# Patient Record
Sex: Female | Born: 1985 | Race: White | Hispanic: No | Marital: Married | Smoking: Never smoker
Health system: Southern US, Community
[De-identification: ages and names within clinical notes are randomized; demographics above are authoritative.]

## PROBLEM LIST (undated history)

## (undated) DIAGNOSIS — N390 Urinary tract infection, site not specified: Secondary | ICD-10-CM

## (undated) DIAGNOSIS — F419 Anxiety disorder, unspecified: Secondary | ICD-10-CM

## (undated) HISTORY — DX: Urinary tract infection, site not specified: N39.0

---

## 2014-03-10 DIAGNOSIS — F32A Depression, unspecified: Secondary | ICD-10-CM | POA: Insufficient documentation

## 2017-08-24 DIAGNOSIS — L720 Epidermal cyst: Secondary | ICD-10-CM | POA: Insufficient documentation

## 2017-08-24 DIAGNOSIS — N390 Urinary tract infection, site not specified: Secondary | ICD-10-CM | POA: Insufficient documentation

## 2017-08-24 DIAGNOSIS — D239 Other benign neoplasm of skin, unspecified: Secondary | ICD-10-CM | POA: Insufficient documentation

## 2020-01-22 ENCOUNTER — Encounter: Payer: Self-pay | Admitting: Certified Nurse Midwife

## 2020-01-22 ENCOUNTER — Ambulatory Visit (INDEPENDENT_AMBULATORY_CARE_PROVIDER_SITE_OTHER): Payer: Self-pay | Admitting: Certified Nurse Midwife

## 2020-01-22 VITALS — BP 107/73 | HR 75 | Ht 63.0 in | Wt 143.0 lb

## 2020-01-22 DIAGNOSIS — N926 Irregular menstruation, unspecified: Secondary | ICD-10-CM

## 2020-01-22 LAB — POCT URINE PREGNANCY: Preg Test, Ur: POSITIVE — AB

## 2020-01-22 NOTE — Patient Instructions (Signed)

## 2020-01-22 NOTE — Progress Notes (Signed)
Subjective:    Tiffany Franco is a 34 y.o. female who presents for evaluation of amenorrhea. She believes she could be pregnant. Pregnancy is desired. Sexual Activity: single partner, contraception: none. Current symptoms also include: nausea. Last period was normal.   Patient's last menstrual period was 11/23/2019 (lmp unknown). The following portions of the patient's history were reviewed and updated as appropriate: allergies, current medications, past family history, past medical history, past social history, past surgical history and problem list.  Review of Systems Pertinent items are noted in HPI.     Objective:    BP 107/73   Pulse 75   Ht 5\' 3"  (1.6 m)   Wt 143 lb (64.9 kg)   LMP 11/23/2019 (LMP Unknown)   BMI 25.33 kg/m  General: alert, cooperative, appears stated age and no acute distress    Lab Review Urine HCG: positive    Assessment:    Absence of menstruation.     Plan:   Positive: EDC: 08/29/20. Briefly discussed pre-natal care options. Midwifery or MD care discussed.  Encouraged well-balanced diet, plenty of rest when needed, pre-natal vitamins daily and walking for exercise. Discussed self-help for nausea, avoiding OTC medications until consulting provider or pharmacist, other than Tylenol as needed, minimal caffeine (1-2 cups daily) and avoiding alcohol. She will schedule dating u/s next week, the nurse visit @ 10 wks and  her initial OB visit @ 12 wks Feel free to call with any questions.   Philip Aspen, CNM

## 2020-01-22 NOTE — Progress Notes (Signed)
PT is present today for confirmation of pregnancy. Pt LMP 11/23/2019. UPT done today results were positive. Pt stated having nausea at night.

## 2020-01-24 ENCOUNTER — Ambulatory Visit (INDEPENDENT_AMBULATORY_CARE_PROVIDER_SITE_OTHER): Payer: Self-pay

## 2020-01-24 DIAGNOSIS — N926 Irregular menstruation, unspecified: Secondary | ICD-10-CM

## 2020-02-13 ENCOUNTER — Ambulatory Visit (INDEPENDENT_AMBULATORY_CARE_PROVIDER_SITE_OTHER): Payer: Self-pay

## 2020-02-13 ENCOUNTER — Ambulatory Visit: Payer: Self-pay | Admitting: Certified Nurse Midwife

## 2020-02-13 ENCOUNTER — Telehealth: Payer: Self-pay | Admitting: Certified Nurse Midwife

## 2020-02-13 ENCOUNTER — Other Ambulatory Visit: Payer: Self-pay

## 2020-02-13 ENCOUNTER — Telehealth: Payer: Self-pay

## 2020-02-13 ENCOUNTER — Other Ambulatory Visit: Payer: Self-pay | Admitting: Certified Nurse Midwife

## 2020-02-13 VITALS — BP 106/70 | HR 71 | Wt 144.7 lb

## 2020-02-13 DIAGNOSIS — Z789 Other specified health status: Secondary | ICD-10-CM

## 2020-02-13 DIAGNOSIS — Z3482 Encounter for supervision of other normal pregnancy, second trimester: Secondary | ICD-10-CM

## 2020-02-13 DIAGNOSIS — Z3A12 12 weeks gestation of pregnancy: Secondary | ICD-10-CM

## 2020-02-13 LAB — OB RESULTS CONSOLE VARICELLA ZOSTER ANTIBODY, IGG: Varicella: IMMUNE

## 2020-02-13 NOTE — Progress Notes (Signed)
Herminia Schillo presents for [place] interview visit. Pregnancy confirmation done 01/22/20.  G3. P2003. Pregnancy education material explained and given. No cats in the home. NOB labs ordered. HIV labs and Drug screen were explained optional and she declined. Drug screen declined. PNV encouraged. Genetic screening options discussed. Genetic testing: Declined.  Pt may discuss with provider. Pt. To follow up with provider in 1 week for NOB physical.  All questions answered.

## 2020-02-13 NOTE — Telephone Encounter (Signed)
Returned patients call- she reports she started bleeding - light pink in color- last PM. Denies any cramping or recent intercourse. Advised to put a pad on and monitor. After 1 hour reach back out to [place] via mychart to report on amount of bleeding. Encouraged to increase fluids also. Patient is in agreement.

## 2020-02-13 NOTE — Telephone Encounter (Signed)
Pt called in and stated that she has been bleeding all night. The pt states she thinks she is having a miscarriage. The pt is requesting a call back from the nurse. I told her I would send a message and someone will call her. Please advise

## 2020-02-14 LAB — CBC WITH DIFFERENTIAL/PLATELET
Basophils Absolute: 0 10*3/uL (ref 0.0–0.2)
Basos: 0 %
EOS (ABSOLUTE): 0.4 10*3/uL (ref 0.0–0.4)
Eos: 4 %
Hematocrit: 39.6 % (ref 34.0–46.6)
Hemoglobin: 13.1 g/dL (ref 11.1–15.9)
Immature Grans (Abs): 0 10*3/uL (ref 0.0–0.1)
Immature Granulocytes: 0 %
Lymphocytes Absolute: 2.1 10*3/uL (ref 0.7–3.1)
Lymphs: 19 %
MCH: 30.3 pg (ref 26.6–33.0)
MCHC: 33.1 g/dL (ref 31.5–35.7)
MCV: 92 fL (ref 79–97)
Monocytes Absolute: 0.8 10*3/uL (ref 0.1–0.9)
Monocytes: 7 %
Neutrophils Absolute: 7.8 10*3/uL — ABNORMAL HIGH (ref 1.4–7.0)
Neutrophils: 70 %
Platelets: 286 10*3/uL (ref 150–450)
RBC: 4.32 x10E6/uL (ref 3.77–5.28)
RDW: 12.3 % (ref 11.7–15.4)
WBC: 11.1 10*3/uL — ABNORMAL HIGH (ref 3.4–10.8)

## 2020-02-14 LAB — MICROSCOPIC EXAMINATION
Casts: NONE SEEN /lpf
RBC, Urine: NONE SEEN /hpf (ref 0–2)

## 2020-02-14 LAB — URINALYSIS, ROUTINE W REFLEX MICROSCOPIC
Bilirubin, UA: NEGATIVE
Glucose, UA: NEGATIVE
Ketones, UA: NEGATIVE
Leukocytes,UA: NEGATIVE
Nitrite, UA: NEGATIVE
Protein,UA: NEGATIVE
Specific Gravity, UA: 1.015 (ref 1.005–1.030)
Urobilinogen, Ur: 0.2 mg/dL (ref 0.2–1.0)
pH, UA: 6 (ref 5.0–7.5)

## 2020-02-14 LAB — ANTIBODY SCREEN: Antibody Screen: NEGATIVE

## 2020-02-14 LAB — RPR: RPR Ser Ql: NONREACTIVE

## 2020-02-14 LAB — "ABO AND RH ": Rh Factor: POSITIVE

## 2020-02-14 LAB — VARICELLA ZOSTER ANTIBODY, IGG: Varicella zoster IgG: 643 index (ref >165)

## 2020-02-14 LAB — HEPATITIS B SURFACE ANTIGEN: Hepatitis B Surface Ag: NEGATIVE

## 2020-02-14 LAB — RUBELLA SCREEN: Rubella Antibodies, IGG: 2.78 index (ref >0.99)

## 2020-02-15 LAB — GC/CHLAMYDIA PROBE AMP
Chlamydia trachomatis, NAA: NEGATIVE
Neisseria Gonorrhoeae by PCR: NEGATIVE

## 2020-02-15 LAB — URINE CULTURE, OB REFLEX: Organism ID, Bacteria: NO GROWTH

## 2020-02-15 LAB — CULTURE, OB URINE

## 2020-02-20 ENCOUNTER — Encounter: Payer: Self-pay | Admitting: Certified Nurse Midwife

## 2020-02-27 ENCOUNTER — Encounter: Payer: Self-pay | Admitting: Certified Nurse Midwife

## 2020-02-27 ENCOUNTER — Ambulatory Visit (INDEPENDENT_AMBULATORY_CARE_PROVIDER_SITE_OTHER): Payer: Self-pay | Admitting: Certified Nurse Midwife

## 2020-02-27 ENCOUNTER — Other Ambulatory Visit: Payer: Self-pay

## 2020-02-27 VITALS — BP 94/61 | HR 71 | Wt 147.3 lb

## 2020-02-27 DIAGNOSIS — Z3A14 14 weeks gestation of pregnancy: Secondary | ICD-10-CM

## 2020-02-27 DIAGNOSIS — Z3482 Encounter for supervision of other normal pregnancy, second trimester: Secondary | ICD-10-CM

## 2020-02-27 DIAGNOSIS — Z8489 Family history of other specified conditions: Secondary | ICD-10-CM | POA: Insufficient documentation

## 2020-02-27 DIAGNOSIS — O09529 Supervision of elderly multigravida, unspecified trimester: Secondary | ICD-10-CM | POA: Insufficient documentation

## 2020-02-27 DIAGNOSIS — Z98891 History of uterine scar from previous surgery: Secondary | ICD-10-CM | POA: Insufficient documentation

## 2020-02-27 DIAGNOSIS — Z8759 Personal history of other complications of pregnancy, childbirth and the puerperium: Secondary | ICD-10-CM | POA: Insufficient documentation

## 2020-02-27 HISTORY — DX: Supervision of elderly multigravida, unspecified trimester: O09.529

## 2020-02-27 LAB — POCT URINALYSIS DIPSTICK OB
Bilirubin, UA: NEGATIVE
Glucose, UA: NEGATIVE
Ketones, UA: NEGATIVE
Leukocytes, UA: NEGATIVE
Nitrite, UA: NEGATIVE
POC,PROTEIN,UA: NEGATIVE
Spec Grav, UA: 1.015 (ref 1.010–1.025)
Urobilinogen, UA: 0.2 E.U./dL
pH, UA: 7.5 (ref 5.0–8.0)

## 2020-02-27 MED ORDER — ASPIRIN EC 81 MG PO TBEC: 81.0000 mg | DELAYED_RELEASE_TABLET | Freq: Every day | ORAL | 11 refills | Status: DC

## 2020-02-27 NOTE — Progress Notes (Signed)
encou

## 2020-02-27 NOTE — Progress Notes (Signed)
NEW OB HISTORY AND PHYSICAL  SUBJECTIVE:       Tiffany Franco is a 34 y.o. G90P2003 female, Patient's last menstrual period was 11/23/2019 (lmp unknown)., Estimated Date of Delivery: 08/26/20, [redacted]w[redacted]d, presents today for establishment of Prenatal Care. She and complains of brown discharge over the past few days.       Gynecologic History Patient's last menstrual period was 11/23/2019 (lmp unknown). Normal Contraception: none Last Pap: 12/11/2020. Results were: normal  Obstetric History OB History  Gravida Para Term Preterm AB Living  3 2 2     3   SAB TAB Ectopic Multiple Live Births        1 3    # Outcome Date GA Lbr Len/2nd Weight Sex Delivery Anes PTL Lv  3 Current           2A Term 05/11/16   7 lb 2 oz (3.232 kg) F CS-LTranv   LIV  2B Term 05/11/16   7 lb 8 oz (3.402 kg) M Vag-Spont   LIV  1 Term 03/03/10   7 lb 8 oz (3.402 kg) F Vag-Spont   LIV    Past Medical History:  Diagnosis Date  . Recurrent UTI     Past Surgical History:  Procedure Laterality Date  . CESAREAN SECTION      Current Outpatient Medications on File Prior to Visit  Medication Sig Dispense Refill  . Prenatal Vit-Fe Fumarate-FA (PRENATAL PO) Take by mouth.     No current facility-administered medications on file prior to visit.    No Known Allergies  Social History   Socioeconomic History  . Marital status: Married    Spouse name: Not on file  . Number of children: Not on file  . Years of education: Not on file  . Highest education level: Not on file  Occupational History  . Not on file  Tobacco Use  . Smoking status: Never Smoker  . Smokeless tobacco: Never Used  Vaping Use  . Vaping Use: Never used  Substance and Sexual Activity  . Alcohol use: Never  . Drug use: Never  . Sexual activity: Yes    Birth control/protection: None  Other Topics Concern  . Not on file  Social History Narrative  . Not on file   Social Determinants of Health   Financial Resource Strain:   .  Difficulty of Paying Living Expenses: Not on file  Food Insecurity:   . Worried About Charity fundraiser in the Last Year: Not on file  . Ran Out of Food in the Last Year: Not on file  Transportation Needs:   . Lack of Transportation (Medical): Not on file  . Lack of Transportation (Non-Medical): Not on file  Physical Activity:   . Days of Exercise per Week: Not on file  . Minutes of Exercise per Session: Not on file  Stress:   . Feeling of Stress : Not on file  Social Connections:   . Frequency of Communication with Friends and Family: Not on file  . Frequency of Social Gatherings with Friends and Family: Not on file  . Attends Religious Services: Not on file  . Active Member of Clubs or Organizations: Not on file  . Attends Archivist Meetings: Not on file  . Marital Status: Not on file  Intimate Partner Violence:   . Fear of Current or Ex-Partner: Not on file  . Emotionally Abused: Not on file  . Physically Abused: Not on file  . Sexually Abused: Not  on file    Family History  Problem Relation Age of Onset  . Healthy Mother   . Healthy Father   . Healthy Sister   . Healthy Brother   . Breast cancer Maternal Grandmother   . Diabetes Maternal Grandmother   . Healthy Maternal Grandfather   . Breast cancer Paternal Grandmother   . Healthy Paternal Grandfather   . Healthy Brother   . Healthy Brother   . Healthy Brother   . Healthy Sister   . Healthy Sister   . Healthy Sister     The following portions of the patient's history were reviewed and updated as appropriate: allergies, current medications, past OB history, past medical history, past surgical history, past family history, past social history, and problem list.    OBJECTIVE: Initial Physical Exam (New OB)  GENERAL APPEARANCE: alert, well appearing, in no apparent distress, oriented to person, place and time HEAD: normocephalic, atraumatic MOUTH: mucous membranes moist, pharynx normal without  lesions THYROID: no thyromegaly or masses present BREASTS:  no significant tenderness, pt declines breast exam. not examined LUNGS: clear to auscultation, no wheezes, rales or rhonchi, symmetric air entry HEART: regular rate and rhythm, no murmurs ABDOMEN: soft, nontender, nondistended, no abnormal masses, no epigastric pain and FHT present EXTREMITIES: no redness or tenderness in the calves or thighs SKIN: normal coloration and turgor, no rashes LYMPH NODES: no adenopathy palpable NEUROLOGIC: alert, oriented, normal speech, no focal findings or movement disorder noted  PELVIC EXAM EXTERNAL GENITALIA: normal appearing vulva with no masses, tenderness or lesions VAGINA: no abnormal discharge or lesions and discharge brownish discharge noted from old blood CERVIX: no lesions or cervical motion tenderness UTERUS: gravid ADNEXA: no masses palpable and nontender OB EXAM PELVIMETRY: appears adequate RECTUM: exam not indicated  ASSESSMENT: Normal pregnancy  PLAN: Prenatal care See ordersNew OB counseling: The patient has been given an overview regarding routine prenatal care. Recommendations regarding diet, weight gain, and exercise in pregnancy were given. Prenatal testing, optional genetic testing, carrier screening ,and ultrasound use in pregnancy were reviewed. Discussed spotting and causing during pregnancy, reassurance given. Reviewed red flag symptoms for bleeding.  Benefits of Breast Feeding were discussed. The patient is encouraged to consider nursing her baby post partum. Follow up 5 wks for anatomy u/s & ROB with Sharyn Lull.   Philip Aspen, CNM

## 2020-04-03 ENCOUNTER — Ambulatory Visit (INDEPENDENT_AMBULATORY_CARE_PROVIDER_SITE_OTHER): Payer: Self-pay | Admitting: Certified Nurse Midwife

## 2020-04-03 ENCOUNTER — Ambulatory Visit (INDEPENDENT_AMBULATORY_CARE_PROVIDER_SITE_OTHER): Payer: Self-pay

## 2020-04-03 ENCOUNTER — Other Ambulatory Visit: Payer: Self-pay

## 2020-04-03 ENCOUNTER — Encounter: Payer: Self-pay | Admitting: Certified Nurse Midwife

## 2020-04-03 VITALS — BP 94/55 | HR 64 | Wt 150.4 lb

## 2020-04-03 DIAGNOSIS — Z3A14 14 weeks gestation of pregnancy: Secondary | ICD-10-CM

## 2020-04-03 DIAGNOSIS — Z3482 Encounter for supervision of other normal pregnancy, second trimester: Secondary | ICD-10-CM

## 2020-04-03 DIAGNOSIS — Z3A19 19 weeks gestation of pregnancy: Secondary | ICD-10-CM

## 2020-04-03 LAB — POCT URINALYSIS DIPSTICK OB
Bilirubin, UA: NEGATIVE
Blood, UA: NEGATIVE
Glucose, UA: NEGATIVE
Ketones, UA: NEGATIVE
Leukocytes, UA: NEGATIVE
Nitrite, UA: NEGATIVE
Spec Grav, UA: 1.01 (ref 1.010–1.025)
Urobilinogen, UA: 0.2 E.U./dL
pH, UA: 8 (ref 5.0–8.0)

## 2020-04-03 NOTE — Patient Instructions (Signed)
Second Trimester of Pregnancy  The second trimester is from week 14 through week 27 (month 4 through 6). This is often the time in pregnancy that you feel your best. Often times, morning sickness has lessened or quit. You may have more energy, and you may get hungry more often. Your unborn baby is growing rapidly. At the end of the sixth month, he or she is about 9 inches long and weighs about 1 pounds. You will likely feel the baby move between 18 and 20 weeks of pregnancy. Follow these instructions at home: Medicines  Take over-the-counter and prescription medicines only as told by your doctor. Some medicines are safe and some medicines are not safe during pregnancy.  Take a prenatal vitamin that contains at least 600 micrograms (mcg) of folic acid.  If you have trouble pooping (constipation), take medicine that will make your stool soft (stool softener) if your doctor approves. Eating and drinking   Eat regular, healthy meals.  Avoid raw meat and uncooked cheese.  If you get low calcium from the food you eat, talk to your doctor about taking a daily calcium supplement.  Avoid foods that are high in fat and sugars, such as fried and sweet foods.  If you feel sick to your stomach (nauseous) or throw up (vomit): ? Eat 4 or 5 small meals a day instead of 3 large meals. ? Try eating a few soda crackers. ? Drink liquids between meals instead of during meals.  To prevent constipation: ? Eat foods that are high in fiber, like fresh fruits and vegetables, whole grains, and beans. ? Drink enough fluids to keep your pee (urine) clear or pale yellow. Activity  Exercise only as told by your doctor. Stop exercising if you start to have cramps.  Do not exercise if it is too hot, too humid, or if you are in a place of great height (high altitude).  Avoid heavy lifting.  Wear low-heeled shoes. Sit and stand up straight.  You can continue to have sex unless your doctor tells you not  to. Relieving pain and discomfort  Wear a good support bra if your breasts are tender.  Take warm water baths (sitz baths) to soothe pain or discomfort caused by hemorrhoids. Use hemorrhoid cream if your doctor approves.  Rest with your legs raised if you have leg cramps or low back pain.  If you develop puffy, bulging veins (varicose veins) in your legs: ? Wear support hose or compression stockings as told by your doctor. ? Raise (elevate) your feet for 15 minutes, 3-4 times a day. ? Limit salt in your food. Prenatal care  Write down your questions. Take them to your prenatal visits.  Keep all your prenatal visits as told by your doctor. This is important. Safety  Wear your seat belt when driving.  Make a list of emergency phone numbers, including numbers for family, friends, the hospital, and police and fire departments. General instructions  Ask your doctor about the right foods to eat or for help finding a counselor, if you need these services.  Ask your doctor about local prenatal classes. Begin classes before month 6 of your pregnancy.  Do not use hot tubs, steam rooms, or saunas.  Do not douche or use tampons or scented sanitary pads.  Do not cross your legs for long periods of time.  Visit your dentist if you have not done so. Use a soft toothbrush to brush your teeth. Floss gently.  Avoid all smoking, herbs,   and alcohol. Avoid drugs that are not approved by your doctor.  Do not use any products that contain nicotine or tobacco, such as cigarettes and e-cigarettes. If you need help quitting, ask your doctor.  Avoid cat litter boxes and soil used by cats. These carry germs that can cause birth defects in the baby and can cause a loss of your baby (miscarriage) or stillbirth. Contact a doctor if:  You have mild cramps or pressure in your lower belly.  You have pain when you pee (urinate).  You have bad smelling fluid coming from your vagina.  You continue to  feel sick to your stomach (nauseous), throw up (vomit), or have watery poop (diarrhea).  You have a nagging pain in your belly area.  You feel dizzy. Get help right away if:  You have a fever.  You are leaking fluid from your vagina.  You have spotting or bleeding from your vagina.  You have severe belly cramping or pain.  You lose or gain weight rapidly.  You have trouble catching your breath and have chest pain.  You notice sudden or extreme puffiness (swelling) of your face, hands, ankles, feet, or legs.  You have not felt the baby move in over an hour.  You have severe headaches that do not go away when you take medicine.  You have trouble seeing. Summary  The second trimester is from week 14 through week 27 (months 4 through 6). This is often the time in pregnancy that you feel your best.  To take care of yourself and your unborn baby, you will need to eat healthy meals, take medicines only if your doctor tells you to do so, and do activities that are safe for you and your baby.  Call your doctor if you get sick or if you notice anything unusual about your pregnancy. Also, call your doctor if you need help with the right food to eat, or if you want to know what activities are safe for you. This information is not intended to replace advice given to you by your health care provider. Make sure you discuss any questions you have with your health care provider. Document Revised: 10/06/2018 Document Reviewed: 07/20/2016 Elsevier Patient Education  [place].   Back Pain in Pregnancy Back pain during pregnancy is common. Back pain may be caused by several factors that are related to changes during your pregnancy. Follow these instructions at home: Managing pain, stiffness, and swelling      If directed, for sudden (acute) back pain, put ice on the painful area. ? Put ice in a plastic bag. ? Place a towel between your skin and the bag. ? Leave the ice on for 20  minutes, 2-3 times per day.  If directed, apply heat to the affected area before you exercise. Use the heat source that your health care provider recommends, such as a moist heat pack or a heating pad. ? Place a towel between your skin and the heat source. ? Leave the heat on for 20-30 minutes. ? Remove the heat if your skin turns bright red. This is especially important if you are unable to feel pain, heat, or cold. You may have a greater risk of getting burned.  If directed, massage the affected area. Activity  Exercise as told by your health care provider. Gentle exercise is the best way to prevent or manage back pain.  Listen to your body when lifting. If lifting hurts, ask for help or bend your  knees. This uses your leg muscles instead of your back muscles.  Squat down when picking up something from the floor. Do not bend over.  Only use bed rest for short periods as told by your health care provider. Bed rest should only be used for the most severe episodes of back pain. Standing, sitting, and lying down  Do not stand in one place for long periods of time.  Use good posture when sitting. Make sure your head rests over your shoulders and is not hanging forward. Use a pillow on your lower back if necessary.  Try sleeping on your side, preferably the left side, with a pregnancy support pillow or 1-2 regular pillows between your legs. ? If you have back pain after a night's rest, your bed may be too soft. ? A firm mattress may provide more support for your back during pregnancy. General instructions  Do not wear high heels.  Eat a healthy diet. Try to gain weight within your health care provider's recommendations.  Use a maternity girdle, elastic sling, or back brace as told by your health care provider.  Take over-the-counter and prescription medicines only as told by your health care provider.  Work with a physical therapist or massage therapist to find ways to manage back  pain. Acupuncture or massage therapy may be helpful.  Keep all follow-up visits as told by your health care provider. This is important. Contact a health care provider if:  Your back pain interferes with your daily activities.  You have increasing pain in other parts of your body. Get help right away if:  You develop numbness, tingling, weakness, or problems with the use of your arms or legs.  You develop severe back pain that is not controlled with medicine.  You have a change in bowel or bladder control.  You develop shortness of breath, dizziness, or you faint.  You develop nausea, vomiting, or sweating.  You have back pain that is a rhythmic, cramping pain similar to labor pains. Labor pain is usually 1-2 minutes apart, lasts for about 1 minute, and involves a bearing down feeling or pressure in your pelvis.  You have back pain and your water breaks or you have vaginal bleeding.  You have back pain or numbness that travels down your leg.  Your back pain developed after you fell.  You develop pain on one side of your back.  You see blood in your urine.  You develop skin blisters in the area of your back pain. Summary  Back pain may be caused by several factors that are related to changes during your pregnancy.  Follow instructions as told by your health care provider for managing pain, stiffness, and swelling.  Exercise as told by your health care provider. Gentle exercise is the best way to prevent or manage back pain.  Take over-the-counter and prescription medicines only as told by your health care provider.  Keep all follow-up visits as told by your health care provider. This is important. This information is not intended to replace advice given to you by your health care provider. Make sure you discuss any questions you have with your health care provider. Document Revised: 10/03/2018 Document Reviewed: 11/30/2017 Elsevier Patient Education  [place].   Round Ligament Pain  The round ligament is a cord of muscle and tissue that helps support the uterus. It can become a source of pain during pregnancy if it becomes stretched or twisted as the baby grows. The pain usually  begins in the second trimester (13-28 weeks) of pregnancy, and it can come and go until the baby is delivered. It is not a serious problem, and it does not cause harm to the baby. Round ligament pain is usually a short, sharp, and pinching pain, but it can also be a dull, lingering, and aching pain. The pain is felt in the lower side of the abdomen or in the groin. It usually starts deep in the groin and moves up to the outside of the hip area. The pain may occur when you:  Suddenly change position, such as quickly going from a sitting to standing position.  Roll over in bed.  Cough or sneeze.  Do physical activity. Follow these instructions at home:   Watch your condition for any changes.  When the pain starts, relax. Then try any of these methods to help with the pain: ? Sitting down. ? Flexing your knees up to your abdomen. ? Lying on your side with one pillow under your abdomen and another pillow between your legs. ? Sitting in a warm bath for 15-20 minutes or until the pain goes away.  Take over-the-counter and prescription medicines only as told by your health care provider.  Move slowly when you sit down or stand up.  Avoid long walks if they cause pain.  Stop or reduce your physical activities if they cause pain.  Keep all follow-up visits as told by your health care provider. This is important. Contact a health care provider if:  Your pain does not go away with treatment.  You feel pain in your back that you did not have before.  Your medicine is not helping. Get help right away if:  You have a fever or chills.  You develop uterine contractions.  You have vaginal bleeding.  You have nausea or vomiting.  You have diarrhea.  You  have pain when you urinate. Summary  Round ligament pain is felt in the lower abdomen or groin. It is usually a short, sharp, and pinching pain. It can also be a dull, lingering, and aching pain.  This pain usually begins in the second trimester (13-28 weeks). It occurs because the uterus is stretching with the growing baby, and it is not harmful to the baby.  You may notice the pain when you suddenly change position, when you cough or sneeze, or during physical activity.  Relaxing, flexing your knees to your abdomen, lying on one side, or taking a warm bath may help to get rid of the pain.  Get help from your health care provider if the pain does not go away or if you have vaginal bleeding, nausea, vomiting, diarrhea, or painful urination. This information is not intended to replace advice given to you by your health care provider. Make sure you discuss any questions you have with your health care provider. Document Revised: 11/30/2017 Document Reviewed: 11/30/2017 Elsevier Patient Education  [place].

## 2020-04-03 NOTE — Progress Notes (Signed)
ROB-Doing well, no questions or concerns. Anatomy scan today completed and normal, see below. Anticipatory guidance regarding course of prenatal care. Reviewed red flag symptoms and when to call. RTC x 4 weeks for ROB or sooner if needed.   ULTRASOUND REPORT  Location: [facility] Date of Service: 04/03/2020   Indications:Anatomy Ultrasound Findings:  Singleton intrauterine pregnancy is visualized with FHR at 143 BPM. Biometrics give an (U/S) Gestational age of [redacted]w[redacted]d and an (U/S) EDD of 08/22/2020; this correlates with the clinically established Estimated Date of Delivery: 08/26/20  Fetal presentation is transverse.  EFW: 308 g ( 11 oz). Fetal Percentile  Placenta: anterior. Grade: 1 AFI: subjectively normal.  Anatomic survey is complete and normal; Gender - surprise.    Right Ovary is normal in appearance. Left Ovary is normal appearance. Survey of the adnexa demonstrates no adnexal masses. There is no free peritoneal fluid in the cul de sac.  Impression: 1. [redacted]w[redacted]d Viable Singleton Intrauterine pregnancy by U/S. 2. (U/S) EDD is consistent with Clinically established Estimated Date of Delivery: 08/26/20 . 3. Normal Anatomy Scan  Recommendations: 1.Clinical correlation with the patient's History and Physical Exam.

## 2020-05-02 ENCOUNTER — Telehealth: Payer: Self-pay

## 2020-05-02 NOTE — Telephone Encounter (Signed)
Patient's husband called in stating that his wife is having what feels like contractions. Patient is not having any vaginal discharge, any vaginal bleeding, nausea or headache. Patient states this has been going on since 10AM and that some of these contractions are mild and some of them are intense. Spoke to nurse and she informed me to take a message.Patient is currently in [place] and have terrible cell service and is requesting to call this number [phone]. Could you please advise?

## 2020-05-02 NOTE — Telephone Encounter (Signed)
Called pt- pt states she started having ctx 5 minutes apart at 9:30/10  am this morning. Per pt's husband,  "some mild some super sharp and painful she could hardly breathe"  Pt states she has not drank a lot of water Denies bleeding or discharge. + FM Pt has not had ctx for about 30 minutes. c/o mild cramping. Informed pt per JML, to drink 80-100 oz of water, rest, pt may take extra strength tylenol per box instructions. Pt aware that if ctx continue she may need to go to the ED. Pt to keep appointment as scheduled or sooner if need. Pt verbalized understanding. Husband stated he will make sure she rests and drinks water.

## 2020-05-07 ENCOUNTER — Encounter: Payer: Self-pay | Admitting: Certified Nurse Midwife

## 2020-05-13 ENCOUNTER — Other Ambulatory Visit: Payer: Self-pay

## 2020-05-13 ENCOUNTER — Ambulatory Visit (INDEPENDENT_AMBULATORY_CARE_PROVIDER_SITE_OTHER): Payer: Self-pay | Admitting: Certified Nurse Midwife

## 2020-05-13 ENCOUNTER — Encounter: Payer: Self-pay | Admitting: Certified Nurse Midwife

## 2020-05-13 VITALS — BP 106/60 | HR 77 | Wt 156.5 lb

## 2020-05-13 DIAGNOSIS — Z3A24 24 weeks gestation of pregnancy: Secondary | ICD-10-CM

## 2020-05-13 NOTE — Progress Notes (Signed)
ROB doing well Feels good movement. Pt state she had episode last week were she felt ctx for a few hours that went away with rest and hydrations. PTL precautions reviewed. Pt encouraged to hydrate and rest when possible. Anticipatory guidance for 28 wk labs , glucose screen reviewed. Follow up 3 wks with Sharyn Lull.   Philip Aspen, CNM

## 2020-05-13 NOTE — Patient Instructions (Signed)
Glucose Tolerance Test During Pregnancy Why am I having this test? The glucose tolerance test (GTT) is done to check how your body processes sugar (glucose). This is one of several tests used to diagnose diabetes that develops during pregnancy (gestational diabetes mellitus). Gestational diabetes is a temporary form of diabetes that some women develop during pregnancy. It usually occurs during the second trimester of pregnancy and goes away after delivery. Testing (screening) for gestational diabetes usually occurs between 24 and 28 weeks of pregnancy. You may have the GTT test after having a 1-hour glucose screening test if the results from that test indicate that you may have gestational diabetes. You may also have this test if:  You have a history of gestational diabetes.  You have a history of giving birth to very large babies or have experienced repeated fetal loss (stillbirth).  You have signs and symptoms of diabetes, such as: ? Changes in your vision. ? Tingling or numbness in your hands or feet. ? Changes in hunger, thirst, and urination that are not otherwise explained by your pregnancy. What is being tested? This test measures the amount of glucose in your blood at different times during a period of 3 hours. This indicates how well your body is able to process glucose. What kind of sample is taken?  Blood samples are required for this test. They are usually collected by inserting a needle into a blood vessel. How do I prepare for this test?  For 3 days before your test, eat normally. Have plenty of carbohydrate-rich foods.  Follow instructions from your health care provider about: ? Eating or drinking restrictions on the day of the test. You may be asked to not eat or drink anything other than water (fast) starting 8-10 hours before the test. ? Changing or stopping your regular medicines. Some medicines may interfere with this test. Tell a health care provider about:  All  medicines you are taking, including vitamins, herbs, eye drops, creams, and over-the-counter medicines.  Any blood disorders you have.  Any surgeries you have had.  Any medical conditions you have. What happens during the test? First, your blood glucose will be measured. This is referred to as your fasting blood glucose, since you fasted before the test. Then, you will drink a glucose solution that contains a certain amount of glucose. Your blood glucose will be measured again 1, 2, and 3 hours after drinking the solution. This test takes about 3 hours to complete. You will need to stay at the testing location during this time. During the testing period:  Do not eat or drink anything other than the glucose solution.  Do not exercise.  Do not use any products that contain nicotine or tobacco, such as cigarettes and e-cigarettes. If you need help stopping, ask your health care provider. The testing procedure may vary among health care providers and hospitals. How are the results reported? Your results will be reported as milligrams of glucose per deciliter of blood (mg/dL) or millimoles per liter (mmol/L). Your health care provider will compare your results to normal ranges that were established after testing a large group of people (reference ranges). Reference ranges may vary among labs and hospitals. For this test, common reference ranges are:  Fasting: less than 95-105 mg/dL (5.3-5.8 mmol/L).  1 hour after drinking glucose: less than 180-190 mg/dL (10.0-10.5 mmol/L).  2 hours after drinking glucose: less than 155-165 mg/dL (8.6-9.2 mmol/L).  3 hours after drinking glucose: 140-145 mg/dL (7.8-8.1 mmol/L). What do the   results mean? Results within reference ranges are considered normal, meaning that your glucose levels are well-controlled. If two or more of your blood glucose levels are high, you may be diagnosed with gestational diabetes. If only one level is high, your health care  provider may suggest repeat testing or other tests to confirm a diagnosis. Talk with your health care provider about what your results mean. Questions to ask your health care provider Ask your health care provider, or the department that is doing the test:  When will my results be ready?  How will I get my results?  What are my treatment options?  What other tests do I need?  What are my next steps? Summary  The glucose tolerance test (GTT) is one of several tests used to diagnose diabetes that develops during pregnancy (gestational diabetes mellitus). Gestational diabetes is a temporary form of diabetes that some women develop during pregnancy.  You may have the GTT test after having a 1-hour glucose screening test if the results from that test indicate that you may have gestational diabetes. You may also have this test if you have any symptoms or risk factors for gestational diabetes.  Talk with your health care provider about what your results mean. This information is not intended to replace advice given to you by your health care provider. Make sure you discuss any questions you have with your health care provider. Document Revised: 10/05/2018 Document Reviewed: 01/24/2017 Elsevier Patient Education  [place].

## 2020-06-05 ENCOUNTER — Other Ambulatory Visit: Payer: Self-pay

## 2020-06-05 DIAGNOSIS — Z3A28 28 weeks gestation of pregnancy: Secondary | ICD-10-CM

## 2020-06-05 DIAGNOSIS — Z3483 Encounter for supervision of other normal pregnancy, third trimester: Secondary | ICD-10-CM

## 2020-06-05 DIAGNOSIS — Z131 Encounter for screening for diabetes mellitus: Secondary | ICD-10-CM

## 2020-06-05 DIAGNOSIS — Z130 Encounter for screening for diseases of the blood and blood-forming organs and certain disorders involving the immune mechanism: Secondary | ICD-10-CM

## 2020-06-06 ENCOUNTER — Other Ambulatory Visit: Payer: Self-pay

## 2020-06-06 ENCOUNTER — Ambulatory Visit (INDEPENDENT_AMBULATORY_CARE_PROVIDER_SITE_OTHER): Payer: Self-pay | Admitting: Certified Nurse Midwife

## 2020-06-06 ENCOUNTER — Encounter: Payer: Self-pay | Admitting: Certified Nurse Midwife

## 2020-06-06 VITALS — BP 97/61 | HR 76 | Wt 165.8 lb

## 2020-06-06 DIAGNOSIS — Z131 Encounter for screening for diabetes mellitus: Secondary | ICD-10-CM | POA: Diagnosis not present

## 2020-06-06 DIAGNOSIS — Z130 Encounter for screening for diseases of the blood and blood-forming organs and certain disorders involving the immune mechanism: Secondary | ICD-10-CM

## 2020-06-06 DIAGNOSIS — Z3483 Encounter for supervision of other normal pregnancy, third trimester: Secondary | ICD-10-CM

## 2020-06-06 DIAGNOSIS — Z3A28 28 weeks gestation of pregnancy: Secondary | ICD-10-CM | POA: Diagnosis not present

## 2020-06-06 LAB — POCT URINALYSIS DIPSTICK OB
Bilirubin, UA: NEGATIVE
Blood, UA: NEGATIVE
Glucose, UA: NEGATIVE
Ketones, UA: NEGATIVE
Leukocytes, UA: NEGATIVE
Nitrite, UA: NEGATIVE
Spec Grav, UA: 1.025 (ref 1.010–1.025)
Urobilinogen, UA: 0.2 E.U./dL
pH, UA: 6.5 (ref 5.0–8.0)

## 2020-06-06 NOTE — Progress Notes (Signed)
ROB- Doing well, no questions or concerns. 28 week labs today, see orders. Breastfeeding education completed, see chart. Discussed birth plan and postpartum birth control- husband plans vasectomy. Third trimester handouts given. Declines TDAP at this time. Discussed red flag symptoms and when to call. RTC x 2 weeks for ROB or sooner if needed.  Tiffany Franco, [place]  06/06/20 10:29 AM

## 2020-06-06 NOTE — Progress Notes (Signed)
I have seen, interviewed, and examined the patient in conjunction with the [place] student midwife and affirm the diagnosis and management plan.   Diona Fanti, CNM [facility], [facility] 06/06/20 11:22 AM

## 2020-06-06 NOTE — Progress Notes (Signed)
Pt present for routine prenatal visit. Pt has not picked up aspirin script from pharmacy. No complaints.

## 2020-06-06 NOTE — Patient Instructions (Signed)
Common Medications Safe in Pregnancy  Acne:      Constipation:  Benzoyl Peroxide     Colace  Clindamycin      Dulcolax Suppository  Topica Erythromycin     Fibercon  Salicylic Acid      Metamucil         Miralax AVOID:        Senakot   Accutane    Cough:  Retin-A       Cough Drops  Tetracycline      Phenergan w/ Codeine if Rx  Minocycline      Robitussin (Plain & DM)  Antibiotics:     Crabs/Lice:  Ceclor       RID  Cephalosporins    AVOID:  E-Mycins      Kwell  Keflex  Macrobid/Macrodantin   Diarrhea:  Penicillin      Kao-Pectate  Zithromax      Imodium AD         PUSH FLUIDS AVOID:       Cipro     Fever:  Tetracycline      Tylenol (Regular or Extra  Minocycline       Strength)  Levaquin      Extra Strength-Do not          Exceed 8 tabs/24 hrs Caffeine:        <200mg/day (equiv. To 1 cup of coffee or  approx. 3 12 oz sodas)         Gas: Cold/Hayfever:       Gas-X  Benadryl      Mylicon  Claritin       Phazyme  **Claritin-D        Chlor-Trimeton    Headaches:  Dimetapp      ASA-Free Excedrin  Drixoral-Non-Drowsy     Cold Compress  Mucinex (Guaifenasin)     Tylenol (Regular or Extra  Sudafed/Sudafed-12 Hour     Strength)  **Sudafed PE Pseudoephedrine   Tylenol Cold & Sinus     Vicks Vapor Rub  Zyrtec  **AVOID if Problems With Blood Pressure         Heartburn: Avoid lying down for at least 1 hour after meals  Aciphex      Maalox     Rash:  Milk of Magnesia     Benadryl    Mylanta       1% Hydrocortisone Cream  Pepcid  Pepcid Complete   Sleep Aids:  Prevacid      Ambien   Prilosec       Benadryl  Rolaids       Chamomile Tea  Tums (Limit 4/day)     Unisom         Tylenol PM         Warm milk-add vanilla or  Hemorrhoids:       Sugar for taste  Anusol/Anusol H.C.  (RX: Analapram 2.5%)  Sugar Substitutes:  Hydrocortisone OTC     Ok in moderation  Preparation H      Tucks        Vaseline lotion applied to tissue with  wiping    Herpes:     Throat:  Acyclovir      Oragel  Famvir  Valtrex     Vaccines:         Flu Shot Leg Cramps:       *Gardasil  Benadryl      Hepatitis A         Hepatitis B Nasal Spray:         Pneumovax  Saline Nasal Spray     Polio Booster         Tetanus Nausea:       Tuberculosis test or PPD  Vitamin B6 25 mg TID   AVOID:    Dramamine      *Gardasil  Emetrol       Live Poliovirus  Ginger Root 250 mg QID    MMR (measles, mumps &  High Complex Carbs @ Bedtime    rebella)  Sea Bands-Accupressure    Varicella (Chickenpox)  Unisom 1/2 tab TID     *No known complications           If received before Pain:         Known pregnancy;   Darvocet       Resume series after  Lortab        Delivery  Percocet    Yeast:   Tramadol      Femstat  Tylenol 3      Gyne-lotrimin  Ultram       Monistat  Vicodin           MISC:         All Sunscreens           Hair Coloring/highlights          Insect Repellant's          (Including DEET)         Mystic Tans    Fetal Movement Counts Patient Name: ________________________________________________ Patient Due Date: ____________________ What is a fetal movement count?  A fetal movement count is the number of times that you feel your baby move during a certain amount of time. This may also be called a fetal kick count. A fetal movement count is recommended for every pregnant woman. You may be asked to start counting fetal movements as early as week 28 of your pregnancy. Pay attention to when your baby is most active. You may notice your baby's sleep and wake cycles. You may also notice things that make your baby move more. You should do a fetal movement count:  When your baby is normally most active.  At the same time each day. A good time to count movements is while you are resting, after having something to eat and drink. How do I count fetal movements? 1. Find a quiet, comfortable area. Sit, or lie down on your side. 2. Write down the  date, the start time and stop time, and the number of movements that you felt between those two times. Take this information with you to your health care visits. 3. Write down your start time when you feel the first movement. 4. Count kicks, flutters, swishes, rolls, and jabs. You should feel at least 10 movements. 5. You may stop counting after you have felt 10 movements, or if you have been counting for 2 hours. Write down the stop time. 6. If you do not feel 10 movements in 2 hours, contact your health care provider for further instructions. Your health care provider may want to do additional tests to assess your baby's well-being. Contact a health care provider if:  You feel fewer than 10 movements in 2 hours.  Your baby is not moving like he or she usually does. Date: ____________ Start time: ____________ Stop time: ____________ Movements: ____________ Date: ____________ Start time: ____________ Stop time: ____________ Movements: ____________ Date: ____________ Start time: ____________ Stop time: ____________ Movements: ____________ Date: ____________ Start time: ____________ Stop time: ____________  Movements: ____________ Date: ____________ Start time: ____________ Stop time: ____________ Movements: ____________ Date: ____________ Start time: ____________ Stop time: ____________ Movements: ____________ Date: ____________ Start time: ____________ Stop time: ____________ Movements: ____________ Date: ____________ Start time: ____________ Stop time: ____________ Movements: ____________ Date: ____________ Start time: ____________ Stop time: ____________ Movements: ____________ This information is not intended to replace advice given to you by your health care provider. Make sure you discuss any questions you have with your health care provider. Document Revised: 02/01/2019 Document Reviewed: 02/01/2019 Elsevier Patient Education  [place] of Pregnancy  The  third trimester is from week 28 through week 40 (months 7 through 9). This trimester is when your unborn baby (fetus) is growing very fast. At the end of the ninth month, the unborn baby is about 20 inches in length. It weighs about 6-10 pounds. Follow these instructions at home: Medicines  Take over-the-counter and prescription medicines only as told by your doctor. Some medicines are safe and some medicines are not safe during pregnancy.  Take a prenatal vitamin that contains at least 600 micrograms (mcg) of folic acid.  If you have trouble pooping (constipation), take medicine that will make your stool soft (stool softener) if your doctor approves. Eating and drinking   Eat regular, healthy meals.  Avoid raw meat and uncooked cheese.  If you get low calcium from the food you eat, talk to your doctor about taking a daily calcium supplement.  Eat four or five small meals rather than three large meals a day.  Avoid foods that are high in fat and sugars, such as fried and sweet foods.  To prevent constipation: ? Eat foods that are high in fiber, like fresh fruits and vegetables, whole grains, and beans. ? Drink enough fluids to keep your pee (urine) clear or pale yellow. Activity  Exercise only as told by your doctor. Stop exercising if you start to have cramps.  Avoid heavy lifting, wear low heels, and sit up straight.  Do not exercise if it is too hot, too humid, or if you are in a place of great height (high altitude).  You may continue to have sex unless your doctor tells you not to. Relieving pain and discomfort  Wear a good support bra if your breasts are tender.  Take frequent breaks and rest with your legs raised if you have leg cramps or low back pain.  Take warm water baths (sitz baths) to soothe pain or discomfort caused by hemorrhoids. Use hemorrhoid cream if your doctor approves.  If you develop puffy, bulging veins (varicose veins) in your legs: ? Wear support  hose or compression stockings as told by your doctor. ? Raise (elevate) your feet for 15 minutes, 3-4 times a day. ? Limit salt in your food. Safety  Wear your seat belt when driving.  Make a list of emergency phone numbers, including numbers for family, friends, the hospital, and police and fire departments. Preparing for your baby's arrival To prepare for the arrival of your baby:  Take prenatal classes.  Practice driving to the hospital.  Visit the hospital and tour the maternity area.  Talk to your work about taking leave once the baby comes.  Pack your hospital bag.  Prepare the baby's room.  Go to your doctor visits.  Buy a rear-facing car seat. Learn how to install it in your car. General instructions  Do not use hot tubs, steam rooms, or saunas.  Do not use any products  that contain nicotine or tobacco, such as cigarettes and e-cigarettes. If you need help quitting, ask your doctor.  Do not drink alcohol.  Do not douche or use tampons or scented sanitary pads.  Do not cross your legs for long periods of time.  Do not travel for long distances unless you must. Only do so if your doctor says it is okay.  Visit your dentist if you have not gone during your pregnancy. Use a soft toothbrush to brush your teeth. Be gentle when you floss.  Avoid cat litter boxes and soil used by cats. These carry germs that can cause birth defects in the baby and can cause a loss of your baby (miscarriage) or stillbirth.  Keep all your prenatal visits as told by your doctor. This is important. Contact a doctor if:  You are not sure if you are in labor or if your water has broken.  You are dizzy.  You have mild cramps or pressure in your lower belly.  You have a nagging pain in your belly area.  You continue to feel sick to your stomach, you throw up, or you have watery poop.  You have bad smelling fluid coming from your vagina.  You have pain when you pee. Get help right  away if:  You have a fever.  You are leaking fluid from your vagina.  You are spotting or bleeding from your vagina.  You have severe belly cramps or pain.  You lose or gain weight quickly.  You have trouble catching your breath and have chest pain.  You notice sudden or extreme puffiness (swelling) of your face, hands, ankles, feet, or legs.  You have not felt the baby move in over an hour.  You have severe headaches that do not go away with medicine.  You have trouble seeing.  You are leaking, or you are having a gush of fluid, from your vagina before you are 37 weeks.  You have regular belly spasms (contractions) before you are 37 weeks. Summary  The third trimester is from week 28 through week 40 (months 7 through 9). This time is when your unborn baby is growing very fast.  Follow your doctor's advice about medicine, food, and activity.  Get ready for the arrival of your baby by taking prenatal classes, getting all the baby items ready, preparing the baby's room, and visiting your doctor to be checked.  Get help right away if you are bleeding from your vagina, or you have chest pain and trouble catching your breath, or if you have not felt your baby move in over an hour. This information is not intended to replace advice given to you by your health care provider. Make sure you discuss any questions you have with your health care provider. Document Revised: 10/05/2018 Document Reviewed: 07/20/2016 Elsevier Patient Education  [place].   WHAT OB PATIENTS CAN EXPECT   Confirmation of pregnancy and ultrasound ordered if medically indicated-[redacted] weeks gestation  New OB (NOB) intake with nurse and New OB (NOB) labs- [redacted] weeks gestation  New OB (NOB) physical examination with provider- 11/[redacted] weeks gestation  Flu vaccine-[redacted] weeks gestation  Anatomy scan-[redacted] weeks gestation  Glucose tolerance test, blood work to test for anemia, T-dap vaccine-[redacted] weeks  gestation  Vaginal swabs/cultures-STD/Group B strep-[redacted] weeks gestation  Appointments every 4 weeks until 28 weeks  Every 2 weeks from 28 weeks until 36 weeks  Weekly visits from 36 weeks until delivery    Vaginal Birth After  Cesarean Delivery  Vaginal birth after cesarean delivery (VBAC) is giving birth vaginally after previously delivering a baby through a cesarean section (C-section). A VBAC may be a safe option for you, depending on your health and other factors. It is important to discuss VBAC with your health care provider early in your pregnancy so you can understand the risks, benefits, and options. Having these discussions early will give you time to make your birth plan. Who are the best candidates for VBAC? The best candidates for VBAC are women who:  Have had one or two prior cesarean deliveries, and the incision made during the delivery was horizontal (low transverse).  Do not have a vertical (classical) scar on their uterus.  Have not had a tear in the wall of their uterus (uterine rupture).  Plan to have more pregnancies. A VBAC is also more likely to be successful:  In women who have previously given birth vaginally.  When labor starts by itself (spontaneously) before the due date. What are the benefits of VBAC? The benefits of delivering your baby vaginally instead of by a cesarean delivery include:  A shorter hospital stay.  A faster recovery time.  Less pain.  Avoiding risks associated with major surgery, such as infection and blood clots.  Less blood loss and less need for donated blood (transfusions). What are the risks of VBAC? The main risk of attempting a VBAC is that it may fail, forcing your health care provider to deliver your baby by a C-section. Other risks are rare and include:  Tearing (rupture) of the scar from a past cesarean delivery.  Other risks associated with vaginal deliveries. If a repeat cesarean delivery is needed, the risks  include:  Blood loss.  Infection.  Blood clot.  Damage to surrounding organs.  Removal of the uterus (hysterectomy), if it is damaged.  Placenta problems in future pregnancies. What else should I know about my options? Delivering a baby through a VBAC is similar to having a normal spontaneous vaginal delivery. Therefore, it is safe:  To try with twins.  For your health care provider to try to turn the baby from a breech position (external cephalic version) during labor.  With epidural analgesia for pain relief. Consider where you would like to deliver your baby. VBAC should be attempted in facilities where an emergency cesarean delivery can be performed. VBAC is not recommended for home births. Any changes in your health or your baby's health during your pregnancy may make it necessary to change your initial decision about VBAC. Your health care provider may recommend that you do not attempt a VBAC if:  Your baby's suspected weight is 8.8 lb (4 kg) or more.  You have preeclampsia. This is a condition that causes high blood pressure along with other symptoms, such as swelling and headaches.  You will have VBAC less than 19 months after your cesarean delivery.  You are past your due date.  You need to have labor started (induced) because your cervix is not ready for labor (unfavorable). Where to find more information  American Pregnancy Association: americanpregnancy.org  [employer] of Obstetricians and Gynecologists: acog.org Summary  Vaginal birth after cesarean delivery (VBAC) is giving birth vaginally after previously delivering a baby through a cesarean section (C-section). A VBAC may be a safe option for you, depending on your health and other factors.  Discuss VBAC with your health care provider early in your pregnancy so you can understand the risks, benefits, options, and have plenty of  time to make your birth plan.  The main risk of attempting a VBAC is that  it may fail, forcing your health care provider to deliver your baby by a C-section. Other risks are rare. This information is not intended to replace advice given to you by your health care provider. Make sure you discuss any questions you have with your health care provider. Document Revised: 10/10/2018 Document Reviewed: 09/21/2016 Elsevier Patient Education  2020 [employer].   Preparing for Vaginal Birth After Cesarean Delivery Vaginal birth after cesarean delivery (VBAC) is giving birth vaginally after previously delivering a baby through a cesarean section (C-section). You and your health are provider will discuss your options and whether you may be a good candidate for VBAC. What are my options? After a cesarean delivery, your options for future deliveries may include:  Scheduled repeat cesarean delivery. This is done in a hospital with an operating room.  Trial of labor after cesarean (TOLAC). A successful TOLAC results in a vaginal delivery. If it is not successful, you will need to have a cesarean delivery. TOLAC should be attempted in facilities where an emergency cesarean delivery can be performed. It should not be done as a home birth. Talk with your health care provider about the risks and benefits of each option early in your pregnancy. The best option for you will depend on your preferences and your overall health as well as your baby's. What should I know about my past cesarean delivery? It is important to know what type of incision was made in your uterus in a past cesarean delivery. The type of incision can affect the success of your TOLAC. Types of incisions include:  Low transverse. This is a side-to-side cut low on your uterus. The scar on your skin looks like a horizontal line just above your pubic area. This type of cut is the most common and makes you a good candidate for TOLAC.  Low vertical. This is an up-and-down cut low on your uterus. The scar on your skin looks  like a vertical line between your pubic area and belly button. This type of cut puts you at higher risk for problems during TOLAC.  High vertical or classical. This is an up-and-down cut high on your uterus. The scar on your skin looks like a vertical line that runs over the top of your belly button. This type of cut has the highest risk for problems and usually means that TOLAC is not an option. When is VBAC not an option? As you progress through your pregnancy, circumstances may change and you may need to reconsider your options. Your situation may also change even as you begin TOLAC. Your health care provider may not want you to attempt a VBAC if you:  Need to have labor started (induced) because your cervix is not ready for labor.  Have never had a vaginal delivery.  Have had more than two cesarean deliveries.  Are overdue.  Are pregnant with a very large baby.  Have a condition that causes high blood pressure (preeclampsia). Questions to ask your health care provider  Am I a good candidate for TOLAC?  What are my chances of a successful vaginal delivery?  Is my preferred birth location equipped for a TOLAC?  What are my pain management options during a TOLAC? Where to find more information  American Congress of Obstetricians and Gynecologists: www.acog.org  [employer] of Nurse-Midwives: www.midwife.org Summary  Vaginal birth after cesarean delivery (VBAC) is giving birth vaginally  after previously delivering a baby through a cesarean section (C-section).  VBAC may be a safe and appropriate option for you depending on your medical history and other risk factors. Talk with your health care provider about the options available to you, and the risks and benefits of each early in your pregnancy.  TOLAC should be attempted in facilities where emergency cesarean section procedures can be performed. This information is not intended to replace advice given to you by your  health care provider. Make sure you discuss any questions you have with your health care provider. Document Revised: 10/10/2018 Document Reviewed: 09/23/2016 Elsevier Patient Education  [place].

## 2020-06-07 LAB — CBC
Hematocrit: 32.1 % — ABNORMAL LOW (ref 34.0–46.6)
Hemoglobin: 11.3 g/dL (ref 11.1–15.9)
MCH: 31.5 pg (ref 26.6–33.0)
MCHC: 35.2 g/dL (ref 31.5–35.7)
MCV: 89 fL (ref 79–97)
Platelets: 243 10*3/uL (ref 150–450)
RBC: 3.59 x10E6/uL — ABNORMAL LOW (ref 3.77–5.28)
RDW: 11.9 % (ref 11.7–15.4)
WBC: 9.7 10*3/uL (ref 3.4–10.8)

## 2020-06-07 LAB — RPR: RPR Ser Ql: NONREACTIVE

## 2020-06-07 LAB — GLUCOSE, 1 HOUR GESTATIONAL: Gestational Diabetes Screen: 117 mg/dL (ref 65–139)

## 2020-06-28 NOTE — L&D Delivery Note (Addendum)
       Delivery Note   Tiffany Franco is a 35 y.o. G3P2003 at [redacted]w[redacted]d Estimated Date of Delivery: 08/21/20  PRE-OPERATIVE DIAGNOSIS:  1) [redacted]w[redacted]d pregnancy.    POST-OPERATIVE DIAGNOSIS:  1) [redacted]w[redacted]d pregnancy s/p Vaginal, Spontaneous , VBAC  Delivery Type: Vaginal, Spontaneous  , VBAC  Delivery Anesthesia: Epidural   Labor Complications:  none    ESTIMATED BLOOD LOSS: 800  ml    FINDINGS:   1) female infant, Apgar scores of  8  at 1 minute and   9 at 5 minutes and a birthweight-pending infant remains skin to skin   2) Nuchal cord: none  SPECIMENS:   PLACENTA:   Appearance:  intact, 3 vessel cord, cord blood sample collected   Removal: Spontaneous      Disposition:   per protocol   DISPOSITION:  Infant to left in stable condition in the delivery room, with L&D personnel and mother,  NARRATIVE SUMMARY: Labor course:  Ms. Tiffany Franco is a G3P2003 at [redacted]w[redacted]d who presented for induction of labor.  She progressed well in labor with pitocin.  She received the appropriate epidural anesthesia and proceeded to complete dilation. She evidenced good maternal expulsive effort during the second stage. She went on to deliver a viable female infant. The placenta delivered without problems and was noted to be complete. A perineal and vaginal examination was performed. Lacerations: Perineal . Laceration was repaired with 3-0 Vicryl Rapide suture. The patient tolerated this well.  Philip Aspen, CNM  08/24/2020 12:48 AM

## 2020-07-02 ENCOUNTER — Ambulatory Visit (INDEPENDENT_AMBULATORY_CARE_PROVIDER_SITE_OTHER): Payer: Self-pay | Admitting: Certified Nurse Midwife

## 2020-07-02 ENCOUNTER — Other Ambulatory Visit: Payer: Self-pay

## 2020-07-02 VITALS — BP 105/57 | HR 82 | Wt 170.5 lb

## 2020-07-02 DIAGNOSIS — Z3A32 32 weeks gestation of pregnancy: Secondary | ICD-10-CM

## 2020-07-02 NOTE — Progress Notes (Signed)
ROB doing well. Feels good movement. Discussed weight gain.Encouraged walks and healthy dietary choices. Discussed musculoskeletal discomforts of pregnancy , reassurance given. Self help measure reviewed. Pt to follow up2 wks with MD for [facility] counseling. She verbalizes and agrees.   Doreene Burke, CNM

## 2020-07-02 NOTE — Patient Instructions (Signed)
[facility]  [facility]  [address], [place], [place] [address]  Phone: [phone]  [facility] (second location)  [address] [place], [place] [address]  Phone: [phone]  [facility] ([place]) [address], [place], [place] [address] Phone: [phone]  [facility]  [address] [place], [place] [address]  Phone: [phone] 

## 2020-07-16 ENCOUNTER — Other Ambulatory Visit: Payer: Self-pay

## 2020-07-16 ENCOUNTER — Encounter: Payer: Self-pay | Admitting: Obstetrics and Gynecology

## 2020-07-16 ENCOUNTER — Ambulatory Visit (INDEPENDENT_AMBULATORY_CARE_PROVIDER_SITE_OTHER): Payer: Self-pay | Admitting: Obstetrics and Gynecology

## 2020-07-16 VITALS — BP 101/67 | HR 80 | Wt 174.5 lb

## 2020-07-16 DIAGNOSIS — Z3A34 34 weeks gestation of pregnancy: Secondary | ICD-10-CM

## 2020-07-16 DIAGNOSIS — Z98891 History of uterine scar from previous surgery: Secondary | ICD-10-CM

## 2020-07-16 DIAGNOSIS — O34219 Maternal care for unspecified type scar from previous cesarean delivery: Secondary | ICD-10-CM

## 2020-07-16 DIAGNOSIS — Z3483 Encounter for supervision of other normal pregnancy, third trimester: Secondary | ICD-10-CM

## 2020-07-16 DIAGNOSIS — Z8759 Personal history of other complications of pregnancy, childbirth and the puerperium: Secondary | ICD-10-CM

## 2020-07-16 LAB — POCT URINALYSIS DIPSTICK OB
Bilirubin, UA: NEGATIVE
Blood, UA: NEGATIVE
Glucose, UA: NEGATIVE
Ketones, UA: NEGATIVE
Leukocytes, UA: NEGATIVE
Nitrite, UA: NEGATIVE
POC,PROTEIN,UA: NEGATIVE
Spec Grav, UA: 1.015 (ref 1.010–1.025)
Urobilinogen, UA: 0.2 E.U./dL
pH, UA: 7 (ref 5.0–8.0)

## 2020-07-16 NOTE — Progress Notes (Signed)
ROB-Pt present for routine prenatal care and TOLAC with a doctor.  Pt stated having cramping and braxton hick contractions.

## 2020-07-16 NOTE — Progress Notes (Signed)
ROB Consult: Patient presents from midwifery to discuss trial of labor after cesarean section (TOLAC) versus elective repeat cesarean delivery (ERCD).  H/o SVD with first pregnancy (with 4th degree tear, infant ~ 7.5 lbs).  Second pregnancy with twins, C-section planned due to malpresentation of second fetus at term. Overall doing well today.  Expresses concerns about this baby feeling larger and risk of failure of TOLAC. Based on fundal height, baby is measuring appropriately, but if size becomes concerning towards the end of pregnancy, could consider a growth scan.    The following risks were discussed today with the patient.  Risk of uterine rupture at term is 0.78 percent with TOLAC and 0.22 percent with ERCD. 1 in 10 uterine ruptures will result in neonatal death or neurological injury. The benefits of a trial of labor after cesarean (TOLAC) resulting in a vaginal birth after cesarean (VBAC) include the following: shorter length of hospital stay and postpartum recovery (in most cases); fewer complications, such as postpartum fever, wound or uterine infection, thromboembolism (blood clots in the leg or lung), need for blood transfusion and fewer neonatal breathing problems. The risks of an attempted VBAC or TOLAC include the following: . Risk of failed trial of labor after cesarean (TOLAC) without a vaginal birth after cesarean (VBAC) resulting in repeat cesarean delivery (RCD) in about 20 to 53 percent of women who attempt VBAC.  Marland Kitchen Risk of rupture of uterus resulting in an emergency cesarean delivery. The risk of uterine rupture may be related in part to the type of uterine incision made during the first cesarean delivery. A previous transverse uterine incision has the lowest risk of rupture (0.2 to 1.5 percent risk). Vertical or T-shaped uterine incisions have a higher risk of uterine rupture (4 to 9 percent risk)The risk of fetal death is very low with both VBAC and elective repeat cesarean delivery  (ERCD), but the likelihood of fetal death is higher with VBAC than with ERCD. Maternal death is very rare with either type of delivery. The risks of an elective repeat cesarean delivery (ERCD) were reviewed with the patient including but not limited to: 07/998 risk of uterine rupture which could have serious consequences, bleeding which may require transfusion; infection which may require antibiotics; injury to bowel, bladder or other surrounding organs (bowel, bladder, ureters); injury to the fetus; need for additional procedures including hysterectomy in the event of a life-threatening hemorrhage; thromboembolic phenomenon; abnormal placentation; incisional problems; death and other postoperative or anesthesia complications.    These risks and benefits are summarized on the consent form, which was reviewed with the patient during the visit. Reviewed VBAC calculator score, 82.4% with 95% confidence interval: 79.8% - 84.7%.  All her questions answered and she signed a consent indicating a preference for TOLAC. She plans for vasectomy for contraception.   Will continue routine postpartum care with midwives, next visit in 2 weeks, for 36 week cultures.

## 2020-07-16 NOTE — Patient Instructions (Addendum)
WHAT OB PATIENTS CAN EXPECT   Confirmation of pregnancy and ultrasound ordered if medically indicated-[redacted] weeks gestation  New OB (NOB) intake with nurse and New OB (NOB) labs- [redacted] weeks gestation  New OB (NOB) physical examination with provider- 11/[redacted] weeks gestation  Flu vaccine-[redacted] weeks gestation  Anatomy scan-[redacted] weeks gestation  Glucose tolerance test, blood work to test for anemia, T-dap vaccine-[redacted] weeks gestation  Vaginal swabs/cultures-STD/Group B strep-[redacted] weeks gestation  Appointments every 4 weeks until 28 weeks  Every 2 weeks from 28 weeks until 36 weeks  Weekly visits from 82 weeks until delivery  Third Trimester of Pregnancy  The third trimester of pregnancy is from week 28 through week 55. This is also called months 7 through 9. This trimester is when your unborn baby (fetus) is growing very fast. At the end of the ninth month, the unborn baby is about 20 inches long. It weighs about 6-10 pounds. Body changes during your third trimester Your body continues to go through many changes during this time. The changes vary and generally return to normal after the baby is born. Physical changes  Your weight will continue to increase. You may gain 25-35 pounds (11-16 kg) by the end of the pregnancy. If you are underweight, you may gain 28-40 lb (about 13-18 kg). If you are overweight, you may gain 15-25 lb (about 7-11 kg).  You may start to get stretch marks on your hips, belly (abdomen), and breasts.  Your breasts will continue to grow and may hurt. A yellow fluid (colostrum) may leak from your breasts. This is the first milk you are making for your baby.  You may have changes in your hair.  Your belly button may stick out.  You may have more swelling in your hands, face, or ankles. Health changes  You may have heartburn.  You may have trouble pooping (constipation).  You may get hemorrhoids. These are swollen veins in the butt that can itch or get painful.  You may  have swollen veins (varicose veins) in your legs.  You may have more body aches in the pelvis, back, or thighs.  You may have more tingling or numbness in your hands, arms, and legs. The skin on your belly may also feel numb.  You may feel short of breath as your womb (uterus) gets bigger. Other changes  You may pee (urinate) more often.  You may have more problems sleeping.  You may notice the unborn baby "dropping," or moving lower in your belly.  You may have more discharge coming from your vagina.  Your joints may feel loose, and you may have pain around your pelvic bone. Follow these instructions at home: Medicines  Take over-the-counter and prescription medicines only as told by your doctor. Some medicines are not safe during pregnancy.  Take a prenatal vitamin that contains at least 600 micrograms (mcg) of folic acid. Eating and drinking  Eat healthy meals that include: ? Fresh fruits and vegetables. ? Whole grains. ? Good sources of protein, such as meat, eggs, or tofu. ? Low-fat dairy products.  Avoid raw meat and unpasteurized juice, milk, and cheese. These carry germs that can harm you and your baby.  Eat 4 or 5 small meals rather than 3 large meals a day.  You may need to take these actions to prevent or treat trouble pooping: ? Drink enough fluids to keep your pee (urine) pale yellow. ? Eat foods that are high in fiber. These include beans, whole grains, and  fresh fruits and vegetables. ? Limit foods that are high in fat and sugar. These include fried or sweet foods. Activity  Exercise only as told by your doctor. Stop exercising if you start to have cramps in your womb.  Avoid heavy lifting.  Do not exercise if it is too hot or too humid, or if you are in a place of great height (high altitude).  If you choose to, you may have sex unless your doctor tells you not to. Relieving pain and discomfort  Take breaks often, and rest with your legs raised  (elevated) if you have leg cramps or low back pain.  Take warm water baths (sitz baths) to soothe pain or discomfort caused by hemorrhoids. Use hemorrhoid cream if your doctor approves.  Wear a good support bra if your breasts are tender.  If you develop bulging, swollen veins in your legs: ? Wear support hose as told by your doctor. ? Raise your feet for 15 minutes, 3-4 times a day. ? Limit salt in your food. Safety  Talk to your doctor before traveling far distances.  Do not use hot tubs, steam rooms, or saunas.  Wear your seat belt at all times when you are in a car.  Talk with your doctor if someone is hurting you or yelling at you a lot. Preparing for your baby's arrival To prepare for the arrival of your baby:  Take prenatal classes.  Visit the hospital and tour the maternity area.  Buy a rear-facing car seat. Learn how to install it in your car.  Prepare the baby's room. Take out all pillows and stuffed animals from the baby's crib. General instructions  Avoid cat litter boxes and soil used by cats. These carry germs that can cause harm to the baby and can cause a loss of your baby by miscarriage or stillbirth.  Do not douche or use tampons. Do not use scented sanitary pads.  Do not smoke or use any products that contain nicotine or tobacco. If you need help quitting, ask your doctor.  Do not drink alcohol.  Do not use herbal medicines, illegal drugs, or medicines that were not approved by your doctor. Chemicals in these products can affect your baby.  Keep all follow-up visits. This is important. Where to find more information  American Pregnancy Association: americanpregnancy.org  [employer]: www.acog.org  Office on Women's Health: KeywordPortfolios.com.br Contact a doctor if:  You have a fever.  You have mild cramps or pressure in your lower belly.  You have a nagging pain in your belly area.  You vomit, or  you have watery poop (diarrhea).  You have bad-smelling fluid coming from your vagina.  You have pain when you pee, or your pee smells bad.  You have a headache that does not go away when you take medicine.  You have changes in how you see, or you see spots in front of your eyes. Get help right away if:  Your water breaks.  You have regular contractions that are less than 5 minutes apart.  You are spotting or bleeding from your vagina.  You have very bad belly cramps or pain.  You have trouble breathing.  You have chest pain.  You faint.  You have not felt the baby move for the amount of time told by your doctor.  You have new or increased pain, swelling, or redness in an arm or leg. Summary  The third trimester is from week 28 through  week 40 (months 7 through 9). This is the time when your unborn baby is growing very fast.  During this time, your discomfort may increase as you gain weight and as your baby grows.  Get ready for your baby to arrive by taking prenatal classes, buying a rear-facing car seat, and preparing the baby's room.  Get help right away if you are bleeding from your vagina, you have chest pain and trouble breathing, or you have not felt the baby move for the amount of time told by your doctor. This information is not intended to replace advice given to you by your health care provider. Make sure you discuss any questions you have with your health care provider. Document Revised: 11/21/2019 Document Reviewed: 09/27/2019 Elsevier Patient Education  [place].     Preparing for Vaginal Birth After Cesarean Delivery Vaginal birth after cesarean delivery (VBAC) is giving birth vaginally after previously delivering a baby through a cesarean section (C-section). You and your health are provider will discuss your options and whether you may be a good candidate for VBAC. What are my options? After a cesarean delivery, your options for future  deliveries may include:  Scheduled repeat cesarean delivery. This is done in a hospital with an operating room.  Trial of labor after cesarean (TOLAC). A successful TOLAC results in a vaginal delivery. If it is not successful, you will need to have a cesarean delivery. TOLAC should be attempted in facilities where an emergency cesarean delivery can be performed. It should not be done as a home birth. Talk with your health care provider about the risks and benefits of each option early in your pregnancy. The best option for you will depend on your preferences and your overall health as well as your baby's. What should I know about my past cesarean delivery? It is important to know what type of incision was made in your uterus in a past cesarean delivery. The type of incision can affect the success of your TOLAC. Types of incisions include:  Low transverse. This is a side-to-side cut low on your uterus. The scar on your skin looks like a horizontal line just above your pubic area. This type of cut is the most common and makes you a good candidate for TOLAC.  Low vertical. This is an up-and-down cut low on your uterus. The scar on your skin looks like a vertical line between your pubic area and belly button. This type of cut puts you at higher risk for problems during TOLAC.  High vertical or classical. This is an up-and-down cut high on your uterus. The scar on your skin looks like a vertical line that runs over the top of your belly button. This type of cut has the highest risk for problems and usually means that TOLAC is not an option.   When is VBAC not an option? As you progress through your pregnancy, circumstances may change and you may need to reconsider your options. Your situation may also change even as you begin TOLAC. Your health care provider may not want you to attempt a VBAC if you:  Need to have labor started (induced) because your cervix is not ready for labor.  Have never had a  vaginal delivery.  Have had more than two cesarean deliveries.  Are overdue.  Are pregnant with a very large baby.  Have a condition that causes high blood pressure (preeclampsia). Questions to ask your health care provider  Am I a good candidate for  TOLAC?  What are my chances of a successful vaginal delivery?  Is my preferred birth location equipped for a TOLAC?  What are my pain management options during a TOLAC? Where to find more information  American [employer] of Obstetricians and Gynecologists: www.acog.[place]: www.midwife.org Summary  Vaginal birth after cesarean delivery (VBAC) is giving birth vaginally after previously delivering a baby through a cesarean section (C-section).  VBAC may be a safe and appropriate option for you depending on your medical history and other risk factors. Talk with your health care provider about the options available to you, and the risks and benefits of each early in your pregnancy.  TOLAC should be attempted in facilities where emergency cesarean section procedures can be performed. This information is not intended to replace advice given to you by your health care provider. Make sure you discuss any questions you have with your health care provider. Document Revised: 10/10/2018 Document Reviewed: 09/23/2016 Elsevier Patient Education  [place].

## 2020-07-17 ENCOUNTER — Encounter: Payer: Self-pay | Admitting: Certified Nurse Midwife

## 2020-07-31 ENCOUNTER — Other Ambulatory Visit: Payer: Self-pay

## 2020-07-31 ENCOUNTER — Ambulatory Visit (INDEPENDENT_AMBULATORY_CARE_PROVIDER_SITE_OTHER): Payer: Self-pay | Admitting: Certified Nurse Midwife

## 2020-07-31 VITALS — BP 103/67 | HR 92 | Wt 179.3 lb

## 2020-07-31 DIAGNOSIS — Z3A36 36 weeks gestation of pregnancy: Secondary | ICD-10-CM | POA: Diagnosis not present

## 2020-07-31 DIAGNOSIS — Z3685 Encounter for antenatal screening for Streptococcus B: Secondary | ICD-10-CM

## 2020-07-31 DIAGNOSIS — O34219 Maternal care for unspecified type scar from previous cesarean delivery: Secondary | ICD-10-CM

## 2020-07-31 DIAGNOSIS — Z3483 Encounter for supervision of other normal pregnancy, third trimester: Secondary | ICD-10-CM

## 2020-07-31 DIAGNOSIS — Z113 Encounter for screening for infections with a predominantly sexual mode of transmission: Secondary | ICD-10-CM

## 2020-07-31 LAB — POCT URINALYSIS DIPSTICK OB
Bilirubin, UA: NEGATIVE
Blood, UA: NEGATIVE
Glucose, UA: NEGATIVE
Ketones, UA: NEGATIVE
Leukocytes, UA: NEGATIVE
Nitrite, UA: NEGATIVE
POC,PROTEIN,UA: NEGATIVE
Spec Grav, UA: 1.02 (ref 1.010–1.025)
Urobilinogen, UA: 0.2 E.U./dL
pH, UA: 6 (ref 5.0–8.0)

## 2020-07-31 NOTE — Patient Instructions (Signed)
Fetal Movement Counts Patient Name: ________________________________________________ Patient Due Date: ____________________  What is a fetal movement count? A fetal movement count is the number of times that you feel your baby move during a certain amount of time. This may also be called a fetal kick count. A fetal movement count is recommended for every pregnant woman. You may be asked to start counting fetal movements as early as week 28 of your pregnancy. Pay attention to when your baby is most active. You may notice your baby's sleep and wake cycles. You may also notice things that make your baby move more. You should do a fetal movement count:  When your baby is normally most active.  At the same time each day. A good time to count movements is while you are resting, after having something to eat and drink. How do I count fetal movements? 1. Find a quiet, comfortable area. Sit, or lie down on your side. 2. Write down the date, the start time and stop time, and the number of movements that you felt between those two times. Take this information with you to your health care visits. 3. Write down your start time when you feel the first movement. 4. Count kicks, flutters, swishes, rolls, and jabs. You should feel at least 10 movements. 5. You may stop counting after you have felt 10 movements, or if you have been counting for 2 hours. Write down the stop time. 6. If you do not feel 10 movements in 2 hours, contact your health care provider for further instructions. Your health care provider may want to do additional tests to assess your baby's well-being. Contact a health care provider if:  You feel fewer than 10 movements in 2 hours.  Your baby is not moving like he or she usually does. Date: ____________ Start time: ____________ Stop time: ____________ Movements: ____________ Date: ____________ Start time: ____________ Stop time: ____________ Movements: ____________ Date: ____________  Start time: ____________ Stop time: ____________ Movements: ____________ Date: ____________ Start time: ____________ Stop time: ____________ Movements: ____________ Date: ____________ Start time: ____________ Stop time: ____________ Movements: ____________ Date: ____________ Start time: ____________ Stop time: ____________ Movements: ____________ Date: ____________ Start time: ____________ Stop time: ____________ Movements: ____________ Date: ____________ Start time: ____________ Stop time: ____________ Movements: ____________ Date: ____________ Start time: ____________ Stop time: ____________ Movements: ____________ This information is not intended to replace advice given to you by your health care provider. Make sure you discuss any questions you have with your health care provider. Document Revised: 02/01/2019 Document Reviewed: 02/01/2019 Elsevier Patient Education  [place].   Preparing for Vaginal Birth After Cesarean Delivery Vaginal birth after cesarean delivery (VBAC) is giving birth vaginally after previously delivering a baby through a cesarean section (C-section). You and your health are provider will discuss your options and whether you may be a good candidate for VBAC. What are my options? After a cesarean delivery, your options for future deliveries may include:  Scheduled repeat cesarean delivery. This is done in a hospital with an operating room.  Trial of labor after cesarean (TOLAC). A successful TOLAC results in a vaginal delivery. If it is not successful, you will need to have a cesarean delivery. TOLAC should be attempted in facilities where an emergency cesarean delivery can be performed. It should not be done as a home birth. Talk with your health care provider about the risks and benefits of each option early in your pregnancy. The best option for you will depend on your preferences  and your overall health as well as your baby's. What should I know about my  past cesarean delivery? It is important to know what type of incision was made in your uterus in a past cesarean delivery. The type of incision can affect the success of your TOLAC. Types of incisions include:  Low transverse. This is a side-to-side cut low on your uterus. The scar on your skin looks like a horizontal line just above your pubic area. This type of cut is the most common and makes you a good candidate for TOLAC.  Low vertical. This is an up-and-down cut low on your uterus. The scar on your skin looks like a vertical line between your pubic area and belly button. This type of cut puts you at higher risk for problems during TOLAC.  High vertical or classical. This is an up-and-down cut high on your uterus. The scar on your skin looks like a vertical line that runs over the top of your belly button. This type of cut has the highest risk for problems and usually means that TOLAC is not an option.   When is VBAC not an option? As you progress through your pregnancy, circumstances may change and you may need to reconsider your options. Your situation may also change even as you begin TOLAC. Your health care provider may not want you to attempt a VBAC if you:  Need to have labor started (induced) because your cervix is not ready for labor.  Have never had a vaginal delivery.  Have had more than two cesarean deliveries.  Are overdue.  Are pregnant with a very large baby.  Have a condition that causes high blood pressure (preeclampsia). Questions to ask your health care provider  Am I a good candidate for TOLAC?  What are my chances of a successful vaginal delivery?  Is my preferred birth location equipped for a TOLAC?  What are my pain management options during a TOLAC? Where to find more information  American Congress of Obstetricians and Gynecologists: www.acog.org  [employer] of Nurse-Midwives: www.midwife.org Summary  Vaginal birth after cesarean delivery (VBAC)  is giving birth vaginally after previously delivering a baby through a cesarean section (C-section).  VBAC may be a safe and appropriate option for you depending on your medical history and other risk factors. Talk with your health care provider about the options available to you, and the risks and benefits of each early in your pregnancy.  TOLAC should be attempted in facilities where emergency cesarean section procedures can be performed. This information is not intended to replace advice given to you by your health care provider. Make sure you discuss any questions you have with your health care provider. Document Revised: 10/10/2018 Document Reviewed: 09/23/2016 Elsevier Patient Education  2021 Else[employer] Inc.   Vaginal Birth After Cesarean Delivery  Vaginal birth after cesarean delivery (VBAC) is giving birth vaginally after previously delivering a baby through a cesarean section (C-section). A VBAC may be a safe option for you, depending on your health and other factors. It is important to discuss VBAC with your health care provider early in your pregnancy so you can understand the risks, benefits, and options. Having these discussions early will give you time to make your birth plan. Who are the best candidates for VBAC? The best candidates for VBAC are women who:  Have had one or two prior cesarean deliveries, and the incision made during the delivery was horizontal (low transverse).  Do not have a vertical (classical)  scar on their uterus.  Have not had a tear in the wall of their uterus (uterine rupture).  Plan to have more pregnancies. A VBAC is also more likely to be successful:  In women who have previously given birth vaginally.  When labor starts by itself (spontaneously) before the due date. What are the benefits of VBAC? The benefits of delivering your baby vaginally instead of by a cesarean delivery include:  A shorter hospital stay.  A faster recovery time.  Less  pain.  Avoiding risks associated with major surgery, such as infection and blood clots.  Less blood loss and less need for donated blood (transfusions). What are the risks of VBAC? The main risk of attempting a VBAC is that it may fail, forcing your health care provider to deliver your baby by a C-section. Other risks are rare and include:  Tearing (rupture) of the scar from a past cesarean delivery.  Other risks associated with vaginal deliveries. If a repeat cesarean delivery is needed, the risks include:  Blood loss.  Infection.  Blood clot.  Damage to surrounding organs.  Removal of the uterus (hysterectomy), if it is damaged.  Placenta problems in future pregnancies. What else should I know about my options? Delivering a baby through a VBAC is similar to having a normal spontaneous vaginal delivery. Therefore, it is safe:  To try with twins.  For your health care provider to try to turn the baby from a breech position (external cephalic version) during labor.  With epidural analgesia for pain relief. Consider where you would like to deliver your baby. VBAC should be attempted in facilities where an emergency cesarean delivery can be performed. VBAC is not recommended for home births. Any changes in your health or your baby's health during your pregnancy may make it necessary to change your initial decision about VBAC. Your health care provider may recommend that you do not attempt a VBAC if:  Your baby's suspected weight is 8.8 lb (4 kg) or more.  You have preeclampsia. This is a condition that causes high blood pressure along with other symptoms, such as swelling and headaches.  You will have VBAC less than 19 months after your cesarean delivery.  You are past your due date.  You need to have labor started (induced) because your cervix is not ready for labor (unfavorable). Where to find more information  American Pregnancy Association:  americanpregnancy.org  [employer] of Obstetricians and Gynecologists: acog.org Summary  Vaginal birth after cesarean delivery (VBAC) is giving birth vaginally after previously delivering a baby through a cesarean section (C-section). A VBAC may be a safe option for you, depending on your health and other factors.  Discuss VBAC with your health care provider early in your pregnancy so you can understand the risks, benefits, options, and have plenty of time to make your birth plan.  The main risk of attempting a VBAC is that it may fail, forcing your health care provider to deliver your baby by a C-section. Other risks are rare. This information is not intended to replace advice given to you by your health care provider. Make sure you discuss any questions you have with your health care provider. Document Revised: 10/10/2018 Document Reviewed: 09/21/2016 Elsevier Patient Education  [place].

## 2020-07-31 NOTE — Progress Notes (Addendum)
ROB-Reports pelvic pressure. Questions induction of labor on 08/19/2020, if possible. Discussed home labor preparation techniques. Herbal prep guide, birth affirmations and pre-labor checklist given. 36 week cultures collected. Anticipatory guidance regarding course of prenatal care. Reviewed red flag symptoms and when to call. RTC x 1 week for ROB with ANNIE or sooner if needed.

## 2020-08-01 ENCOUNTER — Encounter: Payer: Self-pay | Admitting: Certified Nurse Midwife

## 2020-08-02 LAB — GC/CHLAMYDIA PROBE AMP
Chlamydia trachomatis, NAA: NEGATIVE
Neisseria Gonorrhoeae by PCR: NEGATIVE

## 2020-08-02 LAB — STREP GP B NAA: Strep Gp B NAA: NEGATIVE

## 2020-08-04 NOTE — Telephone Encounter (Signed)
Please advise. Thanks Ashayla Subia 

## 2020-08-06 ENCOUNTER — Other Ambulatory Visit: Payer: Self-pay

## 2020-08-06 ENCOUNTER — Ambulatory Visit (INDEPENDENT_AMBULATORY_CARE_PROVIDER_SITE_OTHER): Payer: Self-pay | Admitting: Certified Nurse Midwife

## 2020-08-06 ENCOUNTER — Encounter: Payer: Self-pay | Admitting: Certified Nurse Midwife

## 2020-08-06 VITALS — BP 100/63 | HR 78 | Wt 179.3 lb

## 2020-08-06 DIAGNOSIS — Z3A37 37 weeks gestation of pregnancy: Secondary | ICD-10-CM

## 2020-08-06 LAB — POCT URINALYSIS DIPSTICK OB
Bilirubin, UA: NEGATIVE
Blood, UA: NEGATIVE
Glucose, UA: NEGATIVE
Ketones, UA: NEGATIVE
Leukocytes, UA: NEGATIVE
Nitrite, UA: NEGATIVE
POC,PROTEIN,UA: NEGATIVE
Spec Grav, UA: 1.01 (ref 1.010–1.025)
Urobilinogen, UA: 0.2 E.U./dL
pH, UA: 5 (ref 5.0–8.0)

## 2020-08-06 NOTE — Progress Notes (Signed)
ROB doing well. Feels good movement. States she has had some leaking, sterile spec exam, no fluid seen , nitrazine negative. SVE per pt request. 1 thick , -2/-3. Using cream and benadryl PRN for pupps.  Follow up 1 wk with Sharyn Lull.   Philip Aspen, CNM

## 2020-08-06 NOTE — Patient Instructions (Signed)

## 2020-08-11 ENCOUNTER — Other Ambulatory Visit: Payer: Self-pay

## 2020-08-11 ENCOUNTER — Telehealth: Payer: Self-pay

## 2020-08-11 ENCOUNTER — Observation Stay
Admission: EM | Admit: 2020-08-11 | Discharge: 2020-08-11 | Disposition: A | Discharge status: Home / Self Care | Payer: Medicaid Other | Attending: Certified Nurse Midwife | Admitting: Certified Nurse Midwife

## 2020-08-11 ENCOUNTER — Encounter: Payer: Self-pay | Admitting: Obstetrics and Gynecology

## 2020-08-11 ENCOUNTER — Encounter: Payer: Self-pay | Admitting: Certified Nurse Midwife

## 2020-08-11 ENCOUNTER — Ambulatory Visit (INDEPENDENT_AMBULATORY_CARE_PROVIDER_SITE_OTHER): Payer: Medicaid Other | Admitting: Certified Nurse Midwife

## 2020-08-11 VITALS — BP 108/73 | HR 81 | Wt 182.2 lb

## 2020-08-11 DIAGNOSIS — O2693 Pregnancy related conditions, unspecified, third trimester: Secondary | ICD-10-CM | POA: Diagnosis not present

## 2020-08-11 DIAGNOSIS — O09523 Supervision of elderly multigravida, third trimester: Secondary | ICD-10-CM | POA: Insufficient documentation

## 2020-08-11 DIAGNOSIS — Z3A38 38 weeks gestation of pregnancy: Secondary | ICD-10-CM | POA: Diagnosis not present

## 2020-08-11 DIAGNOSIS — Z3483 Encounter for supervision of other normal pregnancy, third trimester: Secondary | ICD-10-CM

## 2020-08-11 DIAGNOSIS — Z3A37 37 weeks gestation of pregnancy: Secondary | ICD-10-CM

## 2020-08-11 LAB — POCT URINALYSIS DIPSTICK OB
Bilirubin, UA: NEGATIVE
Glucose, UA: NEGATIVE
Ketones, UA: NEGATIVE
Leukocytes, UA: NEGATIVE
Nitrite, UA: NEGATIVE
POC,PROTEIN,UA: NEGATIVE
Spec Grav, UA: >=1.03 — AB (ref 1.010–1.025)
Urobilinogen, UA: 0.2 E.U./dL
pH, UA: 6.5 (ref 5.0–8.0)

## 2020-08-11 LAB — RUPTURE OF MEMBRANE (ROM)PLUS: Rom Plus: NEGATIVE

## 2020-08-11 NOTE — Progress Notes (Signed)
ROB- Patient doing well overall, has increased thin vaginal discharge. Reports changing underwear x 3 this morning and then discharged stopped.  Nitrazine, pooling, and fern test were negative.  Anticipatory guidance regarding prenatal care.  Reviewed red flag symptoms and when to call.   RTC as previously scheduled or sooner if needed.  Daneil Dan, [place] 08/11/20 1:42 PM

## 2020-08-11 NOTE — Telephone Encounter (Signed)
OB 37 6/7- See my chart message  Pt woke up this morning with wet undies. She changed and more came out. It was running down her legs. She changed again. This happened x 3.   + it is not urine.  +lower  back pain- mild  No FM this am.  + for eating/drinking  No VB.  No n/v/d.  Appt made with JML today at 1130. Advised pt that if she developed ctx lasting 5 minutes for several hours or vb to head to ED. Pt voiced understanding.

## 2020-08-11 NOTE — Progress Notes (Signed)
I have seen, interviewed, and examined the patient in conjunction with the [place] [facility] Student and affirm the diagnosis and management plan.   Diona Fanti, CNM [facility], [facility] 08/11/20 3:18 PM

## 2020-08-11 NOTE — Progress Notes (Signed)
OB-Pt present due to noticing watery discharge that was causing her underwear to be wet.  Pt c/o lower back pains, denies any contraction and fetal movement present but not as much movement then usually.

## 2020-08-11 NOTE — Patient Instructions (Signed)
First Stage of Labor Labor is your body's natural process of moving your baby and other structures, including the placenta and umbilical cord, out of your uterus. There are three stages of labor. How long each stage lasts is different for every woman. But certain events happen during each stage that are the same for everyone.  The first stage starts when true labor begins. This stage ends when your cervix, which is the opening from your uterus into your vagina, is completely open (dilated).  The second stage begins when your cervix is fully dilated and you start pushing. This stage ends when your baby is born.  The third stage is the delivery of the organ that nourished your baby during pregnancy (placenta). First stage of labor As your due date gets closer, you may start to notice certain physical changes that mean labor is going to start soon. You may feel that your baby has dropped lower into your pelvis. You may experience irregular, often painless, contractions that go away when you walk around or lie down ([employer] contractions). This is also called false labor. The first stage of labor begins when you start having contractions that come at regular (evenly spaced) intervals and your cervix starts to get thinner and wider in preparation for your baby to pass through. Birth care providers measure the dilation of your cervix in centimeters (cm). One centimeter is a little less than one-half of an inch. The first stage ends when your cervix is dilated to 10 cm. The first stage of labor is divided into three phases:  Early phase.  Active phase.  Transitional phase. The length of the first stage of labor varies. It may be longer if this is your first pregnancy. You may spend most of this stage at home trying to relax and stay comfortable. How does this affect me? During the first stage of labor, you will move through three phases. What happens in the early phase?  You will start to have  regular contractions that last 30-60 seconds. Contractions may come every 5-20 minutes. Keep track of your contractions and call your birth care provider.  Your water may break during this phase.  You may notice a clear or slightly bloody discharge of mucus (mucus plug) from your vagina.  Your cervix will dilate to 3-6 cm. What happens in the active phase? The active phase usually lasts 3-5 hours. You may go to the hospital or birth center around this time. During the active phase:  Your contractions will become stronger, longer, and more uncomfortable.  Your contractions may last 45-90 seconds and come every 3-5 minutes.  You may feel lower back pain.  Your birth care providers may examine your cervix and feel your belly to find the position of your baby.  You may have a monitor strapped to your belly to measure your contractions and your baby's heart rate.  You may start using your pain management options.  Your cervix may be dilated to 6 cm and may start to dilate more quickly. What happens in the transitional phase? The transitional phase typically lasts from 30 minutes to 2 hours. At the end of this phase, your cervix will be fully dilated to 10 cm. During the transitional phase:  Contractions will get stronger and longer.  Contractions may last 60-90 seconds and come less than 2 minutes apart.  You may feel hot flashes, chills, or nausea. How does this affect my baby? During the first stage of labor, your baby will  gradually move down into your birth canal. Follow these instructions at home and in the hospital or birth center:  When labor first begins, try to stay calm. You are still in the early phase. If it is night, try to get some sleep. If it is day, try to relax and save your energy. You may want to make some calls and get ready to go to the hospital or birth center.  When you are in the early phase, try these methods to help ease discomfort: ? Deep breathing and  muscle relaxation. ? Taking a walk. ? Taking a warm bath or shower.  Drink some fluids and have a light snack if you feel like it.  Keep track of your contractions.  Based on the plan you created with your birth care provider, call when your contractions indicate it is time.  If your water breaks, note the time, color, and odor of the fluid.  When you are in the active phase, do your breathing exercises and rely on your support people and your team of birth care providers.   Contact a health care provider if:  Your contractions are strong and regular.  You have lower back pain or cramping.  Your water breaks.  You lose your mucus plug. Get help right away if you:  Have a severe headache that does not go away.  Have changes in your vision.  Have severe pain in your upper belly.  Do not feel the baby move.  Have bright red bleeding. Summary  The first stage of labor starts when true labor begins, and it ends when your cervix is dilated to 10 cm.  The first stage of labor has three phases: early, active, and transitional.  Your baby moves into the birth canal during the first stage of labor.  You may have contractions that become stronger and longer. You may also lose your mucus plug and have your water break.  Call your birth care provider when your contractions are frequent and strong enough to go to the hospital or birth center. This information is not intended to replace advice given to you by your health care provider. Make sure you discuss any questions you have with your health care provider. Document Revised: 10/05/2018 Document Reviewed: 08/28/2017 Elsevier Patient Education  [employer].   Vaginal Delivery  Vaginal delivery means that you give birth by pushing your baby out of your birth canal (vagina). A team of health care providers will help you before, during, and after vaginal delivery. Birth experiences are unique for every woman and every  pregnancy, and birth experiences vary depending on where you choose to give birth. What happens when I arrive at the birth center or hospital? Once you are in labor and have been admitted into the hospital or birth center, your health care provider may:  Review your pregnancy history and any concerns that you have.  Insert an IV into one of your veins. This may be used to give you fluids and medicines.  Check your blood pressure, pulse, temperature, and heart rate (vital signs).  Check whether your bag of water (amniotic sac) has broken (ruptured).  Talk with you about your birth plan and discuss pain control options. Monitoring Your health care provider may monitor your contractions (uterine monitoring) and your baby's heart rate (fetal monitoring). You may need to be monitored:  Often, but not continuously (intermittently).  All the time or for long periods at a time (continuously). Continuous monitoring may be  needed if: ? You are taking certain medicines, such as medicine to relieve pain or make your contractions stronger. ? You have pregnancy or labor complications. Monitoring may be done by:  Placing a special stethoscope or a handheld monitoring device on your abdomen to check your baby's heartbeat and to check for contractions.  Placing monitors on your abdomen (external monitors) to record your baby's heartbeat and the frequency and length of contractions.  Placing monitors inside your uterus through your vagina (internal monitors) to record your baby's heartbeat and the frequency, length, and strength of your contractions. Depending on the type of monitor, it may remain in your uterus or on your baby's head until birth.  Telemetry. This is a type of continuous monitoring that can be done with external or internal monitors. Instead of having to stay in bed, you are able to move around during telemetry. Physical exam Your health care provider may perform frequent physical  exams. This may include:  Checking how and where your baby is positioned in your uterus.  Checking your cervix to determine: ? Whether it is thinning out (effacing). ? Whether it is opening up (dilating). What happens during labor and delivery? Normal labor and delivery is divided into the following three stages: Stage 1  This is the longest stage of labor.  This stage can last for hours or days.  Throughout this stage, you will feel contractions. Contractions generally feel mild, infrequent, and irregular at first. They get stronger, more frequent (about every 2-3 minutes), and more regular as you move through this stage.  This stage ends when your cervix is completely dilated to 4 inches (10 cm) and completely effaced. Stage 2  This stage starts once your cervix is completely effaced and dilated and lasts until the delivery of your baby.  This stage may last from 20 minutes to 2 hours.  This is the stage where you will feel an urge to push your baby out of your vagina.  You may feel stretching and burning pain, especially when the widest part of your baby's head passes through the vaginal opening (crowning).  Once your baby is delivered, the umbilical cord will be clamped and cut. This usually occurs after waiting a period of 1-2 minutes after delivery.  Your baby will be placed on your bare chest (skin-to-skin contact) in an upright position and covered with a warm blanket. Watch your baby for feeding cues, like rooting or sucking, and help the baby to your breast for his or her first feeding. Stage 3  This stage starts immediately after the birth of your baby and ends after you deliver the placenta.  This stage may take anywhere from 5 to 30 minutes.  After your baby has been delivered, you will feel contractions as your body expels the placenta and your uterus contracts to control bleeding.   What can I expect after labor and delivery?  After labor is over, you and your  baby will be monitored closely until you are ready to go home to ensure that you are both healthy. Your health care team will teach you how to care for yourself and your baby.  You and your baby will stay in the same room (rooming in) during your hospital stay. This will encourage early bonding and successful breastfeeding.  You may continue to receive fluids and medicines through an IV.  Your uterus will be checked and massaged regularly (fundal massage).  You will have some soreness and pain in your abdomen,  vagina, and the area of skin between your vaginal opening and your anus (perineum).  If an incision was made near your vagina (episiotomy) or if you had some vaginal tearing during delivery, cold compresses may be placed on your episiotomy or your tear. This helps to reduce pain and swelling.  You may be given a squirt bottle to use instead of wiping when you go to the bathroom. To use the squirt bottle, follow these steps: ? Before you urinate, fill the squirt bottle with warm water. Do not use hot water. ? After you urinate, while you are sitting on the toilet, use the squirt bottle to rinse the area around your urethra and vaginal opening. This rinses away any urine and blood. ? Fill the squirt bottle with clean water every time you use the bathroom.  It is normal to have vaginal bleeding after delivery. Wear a sanitary pad for vaginal bleeding and discharge. Summary  Vaginal delivery means that you will give birth by pushing your baby out of your birth canal (vagina).  Your health care provider may monitor your contractions (uterine monitoring) and your baby's heart rate (fetal monitoring).  Your health care provider may perform a physical exam.  Normal labor and delivery is divided into three stages.  After labor is over, you and your baby will be monitored closely until you are ready to go home. This information is not intended to replace advice given to you by your health  care provider. Make sure you discuss any questions you have with your health care provider. Document Revised: 07/19/2017 Document Reviewed: 07/19/2017 Elsevier Patient Education  2021 [employer].

## 2020-08-11 NOTE — Discharge Summary (Signed)
Obstetric Discharge Summary  Patient ID: Tiffany Franco MRN: [id number] DOB/AGE: 1985/09/05 35 y.o.   Date of Admission: 08/11/2020  Date of Discharge: 08/11/2020  Admitting Diagnosis: Observation at [redacted]w[redacted]d  Secondary Diagnosis:   Patient Active Problem List   Diagnosis Date Noted  . Indication for care in labor and delivery, antepartum 08/11/2020  . Desires VBAC (vaginal birth after cesarean) trial 07/16/2020  . Encounter for supervision of high-risk pregnancy with multigravida of advanced maternal age 40/06/2019  . History of cesarean section, low vertical 02/27/2020  . Family history of fraternal twins 02/27/2020  . History of fourth degree perineal laceration 02/27/2020       Discharge Diagnosis: No other diagnosis   Antepartum Procedures: NST and ROM Plus-negative   Brief Hospital Course    L&D OB Triage Note  Tiffany Franco is a 35 y.o. G90P2003 female at [redacted]w[redacted]d, EDD Estimated Date of Delivery: 08/21/20 who presented to triage for complaints of leakage of fluid since earlier this morning.  She was evaluated by the nurses with no significant findings for spontaneous rupture of membranes, maternal/fetal distress, or labor. Vital signs stable. An NST was performed and has been reviewed by CNM. Her ROM Plus was negative.   NST INTERPRETATION:  Indications: rule out uterine contractions  Mode: External Baseline Rate (A): 140 bpm Variability: Moderate Accelerations: 15 x 15 Decelerations: None Contraction Frequency (min): 5-10  Impression: reactive   Plan: NST performed was reviewed and was found to be reactive. She was discharged home with bleeding/labor precautions.  Continue routine prenatal care. Follow up with CNM as previously scheduled.    Discharge Instructions: Per After Visit Summary  Activity: Also refer to After Visit Summary   Diet: Regular  Medications: Allergies as of 08/11/2020   No Known Allergies     Medication List    TAKE these  medications   aspirin EC 81 MG tablet Take 1 tablet (81 mg total) by mouth daily. Swallow whole.   PRENATAL PO Take by mouth.      Outpatient follow up: As previously scheduled or sooner if needed  Postpartum contraception: vasectomy  Discharged Condition: stable  Discharged to: home   Dani Gobble, CNM  [facility], [facility] 08/11/20 4:25 PM

## 2020-08-14 ENCOUNTER — Encounter: Payer: Self-pay | Admitting: Obstetrics and Gynecology

## 2020-08-14 ENCOUNTER — Other Ambulatory Visit: Payer: Self-pay

## 2020-08-14 ENCOUNTER — Encounter: Payer: Self-pay | Admitting: Certified Nurse Midwife

## 2020-08-14 ENCOUNTER — Observation Stay
Admission: EM | Admit: 2020-08-14 | Discharge: 2020-08-15 | Disposition: A | Discharge status: Home / Self Care | Payer: Medicaid Other | Attending: Certified Nurse Midwife | Admitting: Certified Nurse Midwife

## 2020-08-14 DIAGNOSIS — Z3A39 39 weeks gestation of pregnancy: Secondary | ICD-10-CM | POA: Insufficient documentation

## 2020-08-14 DIAGNOSIS — O471 False labor at or after 37 completed weeks of gestation: Principal | ICD-10-CM | POA: Insufficient documentation

## 2020-08-14 DIAGNOSIS — Z7982 Long term (current) use of aspirin: Secondary | ICD-10-CM | POA: Insufficient documentation

## 2020-08-14 DIAGNOSIS — O368331 Maternal care for abnormalities of the fetal heart rate or rhythm, third trimester, fetus 1: Secondary | ICD-10-CM | POA: Insufficient documentation

## 2020-08-14 NOTE — OB Triage Note (Signed)
Pt is a 34y/o G3P3 at [redacted]w[redacted]d with c/o contractions every 4-8 minutes. Pt states they began at 340PM and got increasingly worse. Pt states +FM. Pt denies VB. Pt states some a small amount of watery discharge. Monitors applied and assessing. Initial FHT 145.

## 2020-08-15 DIAGNOSIS — O471 False labor at or after 37 completed weeks of gestation: Secondary | ICD-10-CM

## 2020-08-15 DIAGNOSIS — Z7982 Long term (current) use of aspirin: Secondary | ICD-10-CM | POA: Diagnosis not present

## 2020-08-15 DIAGNOSIS — Z3A39 39 weeks gestation of pregnancy: Secondary | ICD-10-CM | POA: Diagnosis not present

## 2020-08-15 DIAGNOSIS — O368331 Maternal care for abnormalities of the fetal heart rate or rhythm, third trimester, fetus 1: Secondary | ICD-10-CM | POA: Diagnosis not present

## 2020-08-15 MED ORDER — ZOLPIDEM TARTRATE 5 MG PO TABS
5.0000 mg | ORAL_TABLET | Freq: Once | ORAL | Status: AC
  Administered 2020-08-15: 5 mg via ORAL
  Filled 2020-08-15: qty 1

## 2020-08-15 NOTE — Progress Notes (Signed)
Pt to be d/c home and to self care. Pt given teaching on when to seek medical attention (LOF, VB and decreased FM, etc..). Pt verbalized understanding of d/c instructions.   ?

## 2020-08-15 NOTE — OB Triage Note (Signed)
    L&D OB Triage Note  SUBJECTIVE Tiffany Franco is a 35 y.o. G14P2003 female at [redacted]w[redacted]d, EDD Estimated Date of Delivery: 08/21/20 who presented to triage with complaints of contractions that started late yesterday afternoon. She denies loss of fluid , vaginal bleeding, and feels good fetal movement.   OB History  Gravida Para Term Preterm AB Living  3 2 2  0 0 3  SAB IAB Ectopic Multiple Live Births  0 0 0 1 3    # Outcome Date GA Lbr Len/2nd Weight Sex Delivery Anes PTL Lv  3 Current           2A Term 05/11/16   3232 g F CS-LTranv   LIV  2B Term 05/11/16   3402 g M Vag-Spont   LIV  1 Term 03/03/10   3402 g F Vag-Spont   LIV    Medications Prior to Admission  Medication Sig Dispense Refill Last Dose  . Prenatal Vit-Fe Fumarate-FA (PRENATAL PO) Take by mouth.   08/13/2020 at Unknown time  . aspirin EC 81 MG tablet Take 1 tablet (81 mg total) by mouth daily. Swallow whole. (Patient not taking: Reported on 08/11/2020) 30 tablet 11      OBJECTIVE  Nursing Evaluation:   LMP 11/15/2019    Findings:        False labor, irregular ctx.      NST was performed and has been reviewed by me.  NST INTERPRETATION: Category I  Mode: External Baseline Rate (A): 125 bpm Variability: Moderate Accelerations: 15 x 15 Decelerations: None     Contraction Frequency (min): 2-7  ASSESSMENT Impression:  1.  Pregnancy:  G3P2003 at [redacted]w[redacted]d , EDD Estimated Date of Delivery: 08/21/20 2.  Reassuring fetal and maternal status 3.  No cervical change in 2 plus hrs  PLAN 1. Discussed current condition and above findings with patient and reassurance given.  All questions answered.Ambein offered to assist pt with resting. Encouraged rest, hydration , tub/shower, relaxations methods until ctx increase in intensity and frequency. Red flag symptoms reviewed..  2. Discharge home with standard labor precautions given to return to L&D or call the office for problems. 3. Continue routine prenatal care.  Philip Aspen, CNM

## 2020-08-18 ENCOUNTER — Encounter: Payer: Self-pay | Admitting: Certified Nurse Midwife

## 2020-08-18 ENCOUNTER — Ambulatory Visit (INDEPENDENT_AMBULATORY_CARE_PROVIDER_SITE_OTHER): Payer: Medicaid Other | Admitting: Certified Nurse Midwife

## 2020-08-18 ENCOUNTER — Other Ambulatory Visit: Payer: Self-pay | Admitting: Certified Nurse Midwife

## 2020-08-18 ENCOUNTER — Other Ambulatory Visit: Payer: Self-pay

## 2020-08-18 VITALS — BP 126/70 | HR 83 | Wt 184.4 lb

## 2020-08-18 DIAGNOSIS — Z3A39 39 weeks gestation of pregnancy: Secondary | ICD-10-CM

## 2020-08-18 DIAGNOSIS — O480 Post-term pregnancy: Secondary | ICD-10-CM

## 2020-08-18 DIAGNOSIS — Z3483 Encounter for supervision of other normal pregnancy, third trimester: Secondary | ICD-10-CM

## 2020-08-18 DIAGNOSIS — Z3490 Encounter for supervision of normal pregnancy, unspecified, unspecified trimester: Secondary | ICD-10-CM

## 2020-08-18 LAB — POCT URINALYSIS DIPSTICK OB
Bilirubin, UA: NEGATIVE
Glucose, UA: NEGATIVE
Ketones, UA: NEGATIVE
Leukocytes, UA: NEGATIVE
Nitrite, UA: NEGATIVE
Odor: NEGATIVE
POC,PROTEIN,UA: NEGATIVE
Spec Grav, UA: 1.01 (ref 1.010–1.025)
Urobilinogen, UA: 0.2 E.U./dL
pH, UA: 8 (ref 5.0–8.0)

## 2020-08-18 NOTE — Progress Notes (Signed)
Rob- wants a cervix check.

## 2020-08-18 NOTE — Progress Notes (Addendum)
ROB-Reports irregular contractions, ready to have baby. PUPPS stable with home treatment measures. Requests SVE and membrane sweep. Discussed home labor preparation techniques. Wishes to schedule IOL as soon as possible fears baby is larger than first child, elective induction scheduled 08/23/2020 at 0500. Reviewed red flag symptoms and when to call. RTC x Thursday for growth ultrasound and ROB or sooner if needed.

## 2020-08-18 NOTE — Patient Instructions (Signed)
First Stage of Labor Labor is your body's natural process of moving your baby and other structures, including the placenta and umbilical cord, out of your uterus. There are three stages of labor. How long each stage lasts is different for every woman. But certain events happen during each stage that are the same for everyone.  The first stage starts when true labor begins. This stage ends when your cervix, which is the opening from your uterus into your vagina, is completely open (dilated).  The second stage begins when your cervix is fully dilated and you start pushing. This stage ends when your baby is born.  The third stage is the delivery of the organ that nourished your baby during pregnancy (placenta). First stage of labor As your due date gets closer, you may start to notice certain physical changes that mean labor is going to start soon. You may feel that your baby has dropped lower into your pelvis. You may experience irregular, often painless, contractions that go away when you walk around or lie down ([employer] contractions). This is also called false labor. The first stage of labor begins when you start having contractions that come at regular (evenly spaced) intervals and your cervix starts to get thinner and wider in preparation for your baby to pass through. Birth care providers measure the dilation of your cervix in centimeters (cm). One centimeter is a little less than one-half of an inch. The first stage ends when your cervix is dilated to 10 cm. The first stage of labor is divided into three phases:  Early phase.  Active phase.  Transitional phase. The length of the first stage of labor varies. It may be longer if this is your first pregnancy. You may spend most of this stage at home trying to relax and stay comfortable. How does this affect me? During the first stage of labor, you will move through three phases. What happens in the early phase?  You will start to have  regular contractions that last 30-60 seconds. Contractions may come every 5-20 minutes. Keep track of your contractions and call your birth care provider.  Your water may break during this phase.  You may notice a clear or slightly bloody discharge of mucus (mucus plug) from your vagina.  Your cervix will dilate to 3-6 cm. What happens in the active phase? The active phase usually lasts 3-5 hours. You may go to the hospital or birth center around this time. During the active phase:  Your contractions will become stronger, longer, and more uncomfortable.  Your contractions may last 45-90 seconds and come every 3-5 minutes.  You may feel lower back pain.  Your birth care providers may examine your cervix and feel your belly to find the position of your baby.  You may have a monitor strapped to your belly to measure your contractions and your baby's heart rate.  You may start using your pain management options.  Your cervix may be dilated to 6 cm and may start to dilate more quickly. What happens in the transitional phase? The transitional phase typically lasts from 30 minutes to 2 hours. At the end of this phase, your cervix will be fully dilated to 10 cm. During the transitional phase:  Contractions will get stronger and longer.  Contractions may last 60-90 seconds and come less than 2 minutes apart.  You may feel hot flashes, chills, or nausea. How does this affect my baby? During the first stage of labor, your baby will  gradually move down into your birth canal. Follow these instructions at home and in the hospital or birth center:  When labor first begins, try to stay calm. You are still in the early phase. If it is night, try to get some sleep. If it is day, try to relax and save your energy. You may want to make some calls and get ready to go to the hospital or birth center.  When you are in the early phase, try these methods to help ease discomfort: ? Deep breathing and  muscle relaxation. ? Taking a walk. ? Taking a warm bath or shower.  Drink some fluids and have a light snack if you feel like it.  Keep track of your contractions.  Based on the plan you created with your birth care provider, call when your contractions indicate it is time.  If your water breaks, note the time, color, and odor of the fluid.  When you are in the active phase, do your breathing exercises and rely on your support people and your team of birth care providers.   Contact a health care provider if:  Your contractions are strong and regular.  You have lower back pain or cramping.  Your water breaks.  You lose your mucus plug. Get help right away if you:  Have a severe headache that does not go away.  Have changes in your vision.  Have severe pain in your upper belly.  Do not feel the baby move.  Have bright red bleeding. Summary  The first stage of labor starts when true labor begins, and it ends when your cervix is dilated to 10 cm.  The first stage of labor has three phases: early, active, and transitional.  Your baby moves into the birth canal during the first stage of labor.  You may have contractions that become stronger and longer. You may also lose your mucus plug and have your water break.  Call your birth care provider when your contractions are frequent and strong enough to go to the hospital or birth center. This information is not intended to replace advice given to you by your health care provider. Make sure you discuss any questions you have with your health care provider. Document Revised: 10/05/2018 Document Reviewed: 08/28/2017 Elsevier Patient Education  [place].   Fetal Movement Counts Patient Name: ________________________________________________ Patient Due Date: ____________________  What is a fetal movement count? A fetal movement count is the number of times that you feel your baby move during a certain amount of time.  This may also be called a fetal kick count. A fetal movement count is recommended for every pregnant woman. You may be asked to start counting fetal movements as early as week 28 of your pregnancy. Pay attention to when your baby is most active. You may notice your baby's sleep and wake cycles. You may also notice things that make your baby move more. You should do a fetal movement count:  When your baby is normally most active.  At the same time each day. A good time to count movements is while you are resting, after having something to eat and drink. How do I count fetal movements? 1. Find a quiet, comfortable area. Sit, or lie down on your side. 2. Write down the date, the start time and stop time, and the number of movements that you felt between those two times. Take this information with you to your health care visits. 3. Write down your start time  when you feel the first movement. 4. Count kicks, flutters, swishes, rolls, and jabs. You should feel at least 10 movements. 5. You may stop counting after you have felt 10 movements, or if you have been counting for 2 hours. Write down the stop time. 6. If you do not feel 10 movements in 2 hours, contact your health care provider for further instructions. Your health care provider may want to do additional tests to assess your baby's well-being. Contact a health care provider if:  You feel fewer than 10 movements in 2 hours.  Your baby is not moving like he or she usually does. Date: ____________ Start time: ____________ Stop time: ____________ Movements: ____________ Date: ____________ Start time: ____________ Stop time: ____________ Movements: ____________ Date: ____________ Start time: ____________ Stop time: ____________ Movements: ____________ Date: ____________ Start time: ____________ Stop time: ____________ Movements: ____________ Date: ____________ Start time: ____________ Stop time: ____________ Movements: ____________ Date:  ____________ Start time: ____________ Stop time: ____________ Movements: ____________ Date: ____________ Start time: ____________ Stop time: ____________ Movements: ____________ Date: ____________ Start time: ____________ Stop time: ____________ Movements: ____________ Date: ____________ Start time: ____________ Stop time: ____________ Movements: ____________ This information is not intended to replace advice given to you by your health care provider. Make sure you discuss any questions you have with your health care provider. Document Revised: 02/01/2019 Document Reviewed: 02/01/2019 Elsevier Patient Education  [place].

## 2020-08-18 NOTE — Progress Notes (Signed)
Induction of labor 08/23/2020 at 0500; orders placed, see chart.    Dani Gobble, CNM [facility], [facility] 08/18/20 3:59 PM

## 2020-08-21 ENCOUNTER — Telehealth: Payer: Self-pay | Admitting: Certified Nurse Midwife

## 2020-08-21 ENCOUNTER — Other Ambulatory Visit: Payer: Self-pay

## 2020-08-21 ENCOUNTER — Other Ambulatory Visit: Payer: Medicaid Other

## 2020-08-21 ENCOUNTER — Ambulatory Visit (INDEPENDENT_AMBULATORY_CARE_PROVIDER_SITE_OTHER): Payer: Medicaid Other | Admitting: Certified Nurse Midwife

## 2020-08-21 VITALS — BP 104/62 | HR 78 | Wt 186.1 lb

## 2020-08-21 DIAGNOSIS — Z3A40 40 weeks gestation of pregnancy: Secondary | ICD-10-CM | POA: Diagnosis not present

## 2020-08-21 DIAGNOSIS — Z3689 Encounter for other specified antenatal screening: Secondary | ICD-10-CM | POA: Diagnosis not present

## 2020-08-21 DIAGNOSIS — Z3483 Encounter for supervision of other normal pregnancy, third trimester: Secondary | ICD-10-CM

## 2020-08-21 LAB — POCT URINALYSIS DIPSTICK OB
Appearance: ABNORMAL
Bilirubin, UA: NEGATIVE
Blood, UA: POSITIVE
Glucose, UA: NEGATIVE
Ketones, UA: NEGATIVE
Nitrite, UA: NEGATIVE
Odor: ABNORMAL
Spec Grav, UA: 1.01 (ref 1.010–1.025)
Urobilinogen, UA: 0.2 E.U./dL
pH, UA: 7.5 (ref 5.0–8.0)

## 2020-08-21 LAB — FETAL NONSTRESS TEST

## 2020-08-21 NOTE — Progress Notes (Signed)
ROB: Patient states weakness and numbness in her legs. Baby is low and feels like she is going to fall out.

## 2020-08-21 NOTE — Progress Notes (Signed)
ROB- Doing well overall. Reports some leg weakness when standing, but denies feeling like she's going to fall or falls. Patient has noticed some headaches but no visual disturbances and BP is wnl. NST reactive today, see chart. Reviewed red flag symptoms and when to call. Scheduled for IOL on Saturday morning 08/23/20 or sooner if needed.    Daneil Dan, [place] 08/21/20 3:35 PM

## 2020-08-21 NOTE — Progress Notes (Signed)
I have seen, interviewed, and examined the patient in conjunction with the [facility] and affirm the diagnosis and management plan.   Diona Fanti, CNM [facility], [facility] 08/21/20 4:45 PM

## 2020-08-21 NOTE — Patient Instructions (Addendum)
Fetal Movement Counts Patient Name: ________________________________________________ Patient Due Date: ____________________  What is a fetal movement count? A fetal movement count is the number of times that you feel your baby move during a certain amount of time. This may also be called a fetal kick count. A fetal movement count is recommended for every pregnant woman. You may be asked to start counting fetal movements as early as week 28 of your pregnancy. Pay attention to when your baby is most active. You may notice your baby's sleep and wake cycles. You may also notice things that make your baby move more. You should do a fetal movement count:  When your baby is normally most active.  At the same time each day. A good time to count movements is while you are resting, after having something to eat and drink. How do I count fetal movements? 1. Find a quiet, comfortable area. Sit, or lie down on your side. 2. Write down the date, the start time and stop time, and the number of movements that you felt between those two times. Take this information with you to your health care visits. 3. Write down your start time when you feel the first movement. 4. Count kicks, flutters, swishes, rolls, and jabs. You should feel at least 10 movements. 5. You may stop counting after you have felt 10 movements, or if you have been counting for 2 hours. Write down the stop time. 6. If you do not feel 10 movements in 2 hours, contact your health care provider for further instructions. Your health care provider may want to do additional tests to assess your baby's well-being. Contact a health care provider if:  You feel fewer than 10 movements in 2 hours.  Your baby is not moving like he or she usually does. Date: ____________ Start time: ____________ Stop time: ____________ Movements: ____________ Date: ____________ Start time: ____________ Stop time: ____________ Movements: ____________ Date: ____________  Start time: ____________ Stop time: ____________ Movements: ____________ Date: ____________ Start time: ____________ Stop time: ____________ Movements: ____________ Date: ____________ Start time: ____________ Stop time: ____________ Movements: ____________ Date: ____________ Start time: ____________ Stop time: ____________ Movements: ____________ Date: ____________ Start time: ____________ Stop time: ____________ Movements: ____________ Date: ____________ Start time: ____________ Stop time: ____________ Movements: ____________ Date: ____________ Start time: ____________ Stop time: ____________ Movements: ____________ This information is not intended to replace advice given to you by your health care provider. Make sure you discuss any questions you have with your health care provider. Document Revised: 02/01/2019 Document Reviewed: 02/01/2019 Elsevier Patient Education  2021 [place].   Nonstress Test A nonstress test is a procedure that is done during pregnancy in order to check the baby's heartbeat. The procedure can help to show if the baby (fetus) is healthy. It is commonly done when:  The baby is past his or her due date.  The pregnancy is high risk.  The baby is moving less than normal.  The mother has lost a pregnancy in the past.  The health care provider suspects a problem with the baby's growth.  There is too much or too little amniotic fluid. The procedure is often done in the third trimester of pregnancy to find out if an early delivery is needed and whether such a delivery is safe. During a nonstress test, the baby's heartbeat is monitored when the baby is resting and when the baby is moving. If the baby is healthy, the heart rate will increase when he or she moves or  kicks and will return to normal when he or she rests. Tell a health care provider about:  Any allergies you have.  Any medical conditions you have.  All medicines you are taking, including vitamins,  herbs, eye drops, creams, and over-the-counter medicines.  Any surgeries you have had.  Any past pregnancies you have had. What are the risks? There are no risks to you or your baby from a nonstress test. This procedure should not be painful or uncomfortable. What happens before the procedure?  Eat a meal right before the test or as directed by your health care provider. Food may help encourage the baby to move.  Use the restroom right before the test. What happens during the procedure?  Two monitors will be placed on your abdomen. One will record the baby's heart rate and the other will record the contractions of your uterus.  You may be asked to lie down on your side or to sit upright.  You may be given a button to press when you feel your baby move.  Your health care provider will listen to your baby's heartbeat and record it. He or she may also watch your baby's heartbeat on a screen.  If the baby seems to be sleeping, you may be asked to drink some juice or soda, eat a snack, or change positions. The procedure may vary among health care providers and hospitals.   What can I expect after procedure?  Your health care provider will discuss the test results with you and make recommendations for the future. Depending on the results, your health care provider may order additional tests or another course of action.  If your health care provider gave you any diet or activity instructions, make sure to follow them.  Keep all follow-up visits. This is important. Summary  A nonstress test is a procedure that is done during pregnancy in order to check the baby's heartbeat. The procedure can help show if the baby is healthy.  The procedure is often done in the third trimester of pregnancy to find out if an early delivery is needed and whether such a delivery is safe.  During a nonstress test, the baby's heartbeat is monitored when the baby is resting and when the baby is moving. If the  baby is healthy, the heart rate will increase when he or she moves or kicks and will return to normal when he or she rests.  Your health care provider will discuss the test results with you and make recommendations for the future. This information is not intended to replace advice given to you by your health care provider. Make sure you discuss any questions you have with your health care provider. Document Revised: 03/24/2020 Document Reviewed: 03/24/2020 Elsevier Patient Education  2021 [place] is your body's natural process of moving your baby and other structures, including the placenta and umbilical cord, out of your uterus. There are three stages of labor. How long each stage lasts is different for every woman. But certain events happen during each stage that are the same for everyone.  The first stage starts when true labor begins. This stage ends when your cervix, which is the opening from your uterus into your vagina, is completely open (dilated).  The second stage begins when your cervix is fully dilated and you start pushing. This stage ends when your baby is born.  The third stage is the delivery of the organ that nourished your baby  during pregnancy (placenta). First stage of labor As your due date gets closer, you may start to notice certain physical changes that mean labor is going to start soon. You may feel that your baby has dropped lower into your pelvis. You may experience irregular, often painless, contractions that go away when you walk around or lie down ([employer] contractions). This is also called false labor. The first stage of labor begins when you start having contractions that come at regular (evenly spaced) intervals and your cervix starts to get thinner and wider in preparation for your baby to pass through. Birth care providers measure the dilation of your cervix in centimeters (cm). One centimeter is a little less than  one-half of an inch. The first stage ends when your cervix is dilated to 10 cm. The first stage of labor is divided into three phases:  Early phase.  Active phase.  Transitional phase. The length of the first stage of labor varies. It may be longer if this is your first pregnancy. You may spend most of this stage at home trying to relax and stay comfortable. How does this affect me? During the first stage of labor, you will move through three phases. What happens in the early phase?  You will start to have regular contractions that last 30-60 seconds. Contractions may come every 5-20 minutes. Keep track of your contractions and call your birth care provider.  Your water may break during this phase.  You may notice a clear or slightly bloody discharge of mucus (mucus plug) from your vagina.  Your cervix will dilate to 3-6 cm. What happens in the active phase? The active phase usually lasts 3-5 hours. You may go to the hospital or birth center around this time. During the active phase:  Your contractions will become stronger, longer, and more uncomfortable.  Your contractions may last 45-90 seconds and come every 3-5 minutes.  You may feel lower back pain.  Your birth care providers may examine your cervix and feel your belly to find the position of your baby.  You may have a monitor strapped to your belly to measure your contractions and your baby's heart rate.  You may start using your pain management options.  Your cervix may be dilated to 6 cm and may start to dilate more quickly. What happens in the transitional phase? The transitional phase typically lasts from 30 minutes to 2 hours. At the end of this phase, your cervix will be fully dilated to 10 cm. During the transitional phase:  Contractions will get stronger and longer.  Contractions may last 60-90 seconds and come less than 2 minutes apart.  You may feel hot flashes, chills, or nausea. How does this affect my  baby? During the first stage of labor, your baby will gradually move down into your birth canal. Follow these instructions at home and in the hospital or birth center:  When labor first begins, try to stay calm. You are still in the early phase. If it is night, try to get some sleep. If it is day, try to relax and save your energy. You may want to make some calls and get ready to go to the hospital or birth center.  When you are in the early phase, try these methods to help ease discomfort: ? Deep breathing and muscle relaxation. ? Taking a walk. ? Taking a warm bath or shower.  Drink some fluids and have a light snack if you feel like it.  Keep  track of your contractions.  Based on the plan you created with your birth care provider, call when your contractions indicate it is time.  If your water breaks, note the time, color, and odor of the fluid.  When you are in the active phase, do your breathing exercises and rely on your support people and your team of birth care providers.   Contact a health care provider if:  Your contractions are strong and regular.  You have lower back pain or cramping.  Your water breaks.  You lose your mucus plug. Get help right away if you:  Have a severe headache that does not go away.  Have changes in your vision.  Have severe pain in your upper belly.  Do not feel the baby move.  Have bright red bleeding. Summary  The first stage of labor starts when true labor begins, and it ends when your cervix is dilated to 10 cm.  The first stage of labor has three phases: early, active, and transitional.  Your baby moves into the birth canal during the first stage of labor.  You may have contractions that become stronger and longer. You may also lose your mucus plug and have your water break.  Call your birth care provider when your contractions are frequent and strong enough to go to the hospital or birth center. This information is not  intended to replace advice given to you by your health care provider. Make sure you discuss any questions you have with your health care provider. Document Revised: 10/05/2018 Document Reviewed: 08/28/2017 Elsevier Patient Education  [place].    Vaginal Delivery  Vaginal delivery means that you give birth by pushing your baby out of your birth canal (vagina). A team of health care providers will help you before, during, and after vaginal delivery. Birth experiences are unique for every woman and every pregnancy, and birth experiences vary depending on where you choose to give birth. What happens when I arrive at the birth center or hospital? Once you are in labor and have been admitted into the hospital or birth center, your health care provider may:  Review your pregnancy history and any concerns that you have.  Insert an IV into one of your veins. This may be used to give you fluids and medicines.  Check your blood pressure, pulse, temperature, and heart rate (vital signs).  Check whether your bag of water (amniotic sac) has broken (ruptured).  Talk with you about your birth plan and discuss pain control options. Monitoring Your health care provider may monitor your contractions (uterine monitoring) and your baby's heart rate (fetal monitoring). You may need to be monitored:  Often, but not continuously (intermittently).  All the time or for long periods at a time (continuously). Continuous monitoring may be needed if: ? You are taking certain medicines, such as medicine to relieve pain or make your contractions stronger. ? You have pregnancy or labor complications. Monitoring may be done by:  Placing a special stethoscope or a handheld monitoring device on your abdomen to check your baby's heartbeat and to check for contractions.  Placing monitors on your abdomen (external monitors) to record your baby's heartbeat and the frequency and length of contractions.  Placing  monitors inside your uterus through your vagina (internal monitors) to record your baby's heartbeat and the frequency, length, and strength of your contractions. Depending on the type of monitor, it may remain in your uterus or on your baby's head until birth.  Telemetry.  This is a type of continuous monitoring that can be done with external or internal monitors. Instead of having to stay in bed, you are able to move around during telemetry. Physical exam Your health care provider may perform frequent physical exams. This may include:  Checking how and where your baby is positioned in your uterus.  Checking your cervix to determine: ? Whether it is thinning out (effacing). ? Whether it is opening up (dilating). What happens during labor and delivery? Normal labor and delivery is divided into the following three stages: Stage 1  This is the longest stage of labor.  This stage can last for hours or days.  Throughout this stage, you will feel contractions. Contractions generally feel mild, infrequent, and irregular at first. They get stronger, more frequent (about every 2-3 minutes), and more regular as you move through this stage.  This stage ends when your cervix is completely dilated to 4 inches (10 cm) and completely effaced. Stage 2  This stage starts once your cervix is completely effaced and dilated and lasts until the delivery of your baby.  This stage may last from 20 minutes to 2 hours.  This is the stage where you will feel an urge to push your baby out of your vagina.  You may feel stretching and burning pain, especially when the widest part of your baby's head passes through the vaginal opening (crowning).  Once your baby is delivered, the umbilical cord will be clamped and cut. This usually occurs after waiting a period of 1-2 minutes after delivery.  Your baby will be placed on your bare chest (skin-to-skin contact) in an upright position and covered with a warm blanket.  Watch your baby for feeding cues, like rooting or sucking, and help the baby to your breast for his or her first feeding. Stage 3  This stage starts immediately after the birth of your baby and ends after you deliver the placenta.  This stage may take anywhere from 5 to 30 minutes.  After your baby has been delivered, you will feel contractions as your body expels the placenta and your uterus contracts to control bleeding.   What can I expect after labor and delivery?  After labor is over, you and your baby will be monitored closely until you are ready to go home to ensure that you are both healthy. Your health care team will teach you how to care for yourself and your baby.  You and your baby will stay in the same room (rooming in) during your hospital stay. This will encourage early bonding and successful breastfeeding.  You may continue to receive fluids and medicines through an IV.  Your uterus will be checked and massaged regularly (fundal massage).  You will have some soreness and pain in your abdomen, vagina, and the area of skin between your vaginal opening and your anus (perineum).  If an incision was made near your vagina (episiotomy) or if you had some vaginal tearing during delivery, cold compresses may be placed on your episiotomy or your tear. This helps to reduce pain and swelling.  You may be given a squirt bottle to use instead of wiping when you go to the bathroom. To use the squirt bottle, follow these steps: ? Before you urinate, fill the squirt bottle with warm water. Do not use hot water. ? After you urinate, while you are sitting on the toilet, use the squirt bottle to rinse the area around your urethra and vaginal opening. This rinses  away any urine and blood. ? Fill the squirt bottle with clean water every time you use the bathroom.  It is normal to have vaginal bleeding after delivery. Wear a sanitary pad for vaginal bleeding and discharge. Summary  Vaginal  delivery means that you will give birth by pushing your baby out of your birth canal (vagina).  Your health care provider may monitor your contractions (uterine monitoring) and your baby's heart rate (fetal monitoring).  Your health care provider may perform a physical exam.  Normal labor and delivery is divided into three stages.  After labor is over, you and your baby will be monitored closely until you are ready to go home. This information is not intended to replace advice given to you by your health care provider. Make sure you discuss any questions you have with your health care provider. Document Revised: 07/19/2017 Document Reviewed: 07/19/2017 Elsevier Patient Education  2021 [place] Induction Labor induction is when steps are taken to cause a pregnant woman to begin the labor process. Most women go into labor on their own between 37 weeks and 42 weeks of pregnancy. When this does not happen, or when there is a medical need for labor to begin, steps may be taken to induce, or bring on, labor. Labor induction causes a pregnant woman's uterus to contract. It also causes the cervix to soften (ripen), open (dilate), and thin out. Usually, labor is not induced before 39 weeks of pregnancy unless there is a medical reason to do so. When is labor induction considered? Labor induction may be right for you if:  Your pregnancy lasts longer than 41 to 42 weeks.  Your placenta is separating from your uterus (placental abruption).  You have a rupture of membranes and your labor does not begin.  You have health problems, like diabetes or high blood pressure (preeclampsia) during your pregnancy.  Your baby has stopped growing or does not have enough amniotic fluid. Before labor induction begins, your health care provider will consider the following factors:  Your medical condition and the baby's condition.  How many weeks you have been pregnant.  How mature the baby's  lungs are.  The condition of your cervix.  The position of the baby.  The size of your birth canal. Tell a health care provider about:  Any allergies you have.  All medicines you are taking, including vitamins, herbs, eye drops, creams, and over-the-counter medicines.  Any problems you or your family members have had with anesthetic medicines.  Any surgeries you have had.  Any blood disorders you have.  Any medical conditions you have. What are the risks? Generally, this is a safe procedure. However, problems may occur, including:  Failed induction.  Changes in fetal heart rate, such as being too high, too low, or irregular (erratic).  Infection in the mother or the baby.  Increased risk of having a cesarean delivery.  Breaking off (abruption) of the placenta from the uterus. This is rare.  Rupture of the uterus. This is very rare.  Your baby could fail to get enough blood flow or oxygen. This can be life-threatening. When induction is needed for medical reasons, the benefits generally outweigh the risks. What happens during the procedure? During the procedure, your health care provider will use one of these methods to induce labor:  Stripping the membranes. In this method, the amniotic sac tissue is gently separated from the cervix. This causes the following to happen: ? Your cervix stretches,  which in turn causes the release of prostaglandins. ? Prostaglandins induce labor and cause the uterus to contract. ? This procedure is often done in an office visit. You will be sent home to wait for contractions to begin.  Prostaglandin medicine. This medicine starts contractions and causes the cervix to dilate and ripen. This can be taken by mouth (orally) or by being inserted into the vagina (suppository).  Inserting a small, thin tube (catheter) with a balloon into the vagina and then expanding the balloon with water to dilate the cervix.  Breaking the water. In this  method, a small instrument is used to make a small hole in the amniotic sac. This eventually causes the amniotic sac to break. Contractions should begin within a few hours.  Medicine to trigger or strengthen contractions. This medicine is given through an IV that is inserted into a vein in your arm. This procedure may vary among health care providers and hospitals.   Where to find more information  [employer]: www.marchofdimes.org  [employer]: www.acog.org Summary  Labor induction causes a pregnant woman's uterus to contract. It also causes the cervix to soften (ripen), open (dilate), and thin out.  Labor is usually not induced before 39 weeks of pregnancy unless there is a medical reason to do so.  When induction is needed for medical reasons, the benefits generally outweigh the risks.  Talk with your health care provider about which methods of labor induction are right for you. This information is not intended to replace advice given to you by your health care provider. Make sure you discuss any questions you have with your health care provider. Document Revised: 03/27/2020 Document Reviewed: 03/27/2020 Elsevier Patient Education  [place].

## 2020-08-21 NOTE — Telephone Encounter (Signed)
[employer] [employer] is not in office today- I made patient aware- she declined rescheduling the u/s due to being induced this Saturday. I did make pt aware she is still scheduled to see michelle for Rob today at 1:30.

## 2020-08-23 ENCOUNTER — Inpatient Hospital Stay: Payer: Medicaid Other | Admitting: Anesthesiology

## 2020-08-23 ENCOUNTER — Other Ambulatory Visit: Payer: Self-pay

## 2020-08-23 ENCOUNTER — Inpatient Hospital Stay
Admission: EM | Admit: 2020-08-23 | Discharge: 2020-08-25 | DRG: 807 | Disposition: A | Discharge status: Home / Self Care | Payer: Medicaid Other | Attending: Certified Nurse Midwife | Admitting: Certified Nurse Midwife

## 2020-08-23 ENCOUNTER — Encounter: Payer: Self-pay | Admitting: Certified Nurse Midwife

## 2020-08-23 DIAGNOSIS — O34219 Maternal care for unspecified type scar from previous cesarean delivery: Secondary | ICD-10-CM | POA: Diagnosis not present

## 2020-08-23 DIAGNOSIS — Z3490 Encounter for supervision of normal pregnancy, unspecified, unspecified trimester: Secondary | ICD-10-CM | POA: Diagnosis present

## 2020-08-23 DIAGNOSIS — Z20822 Contact with and (suspected) exposure to covid-19: Secondary | ICD-10-CM | POA: Diagnosis not present

## 2020-08-23 DIAGNOSIS — O09523 Supervision of elderly multigravida, third trimester: Secondary | ICD-10-CM | POA: Diagnosis not present

## 2020-08-23 DIAGNOSIS — Z98891 History of uterine scar from previous surgery: Secondary | ICD-10-CM

## 2020-08-23 DIAGNOSIS — O34211 Maternal care for low transverse scar from previous cesarean delivery: Secondary | ICD-10-CM | POA: Diagnosis not present

## 2020-08-23 DIAGNOSIS — Z3A40 40 weeks gestation of pregnancy: Secondary | ICD-10-CM | POA: Diagnosis not present

## 2020-08-23 DIAGNOSIS — O480 Post-term pregnancy: Secondary | ICD-10-CM | POA: Diagnosis not present

## 2020-08-23 HISTORY — DX: Anxiety disorder, unspecified: F41.9

## 2020-08-23 LAB — CBC
HCT: 34.4 % — ABNORMAL LOW (ref 36.0–46.0)
Hemoglobin: 11.3 g/dL — ABNORMAL LOW (ref 12.0–15.0)
MCH: 29.4 pg (ref 26.0–34.0)
MCHC: 32.8 g/dL (ref 30.0–36.0)
MCV: 89.6 fL (ref 80.0–100.0)
Platelets: 219 10*3/uL (ref 150–400)
RBC: 3.84 MIL/uL — ABNORMAL LOW (ref 3.87–5.11)
RDW: 13.7 % (ref 11.5–15.5)
WBC: 9.5 10*3/uL (ref 4.0–10.5)
nRBC: 0 % (ref 0.0–0.2)

## 2020-08-23 LAB — RESP PANEL BY RT-PCR (FLU A&B, COVID) ARPGX2
Influenza A by PCR: NEGATIVE
Influenza B by PCR: NEGATIVE
SARS Coronavirus 2 by RT PCR: NEGATIVE

## 2020-08-23 LAB — TYPE AND SCREEN
ABO/RH(D): O POS
Antibody Screen: NEGATIVE

## 2020-08-23 LAB — ABO/RH: ABO/RH(D): O POS

## 2020-08-23 MED ORDER — LACTATED RINGERS IV SOLN
500.0000 mL | Freq: Once | INTRAVENOUS | Status: AC
  Administered 2020-08-23: 250 mL via INTRAVENOUS

## 2020-08-23 MED ORDER — PHENYLEPHRINE 40 MCG/ML (10ML) SYRINGE FOR IV PUSH (FOR BLOOD PRESSURE SUPPORT)
80.0000 ug | PREFILLED_SYRINGE | INTRAVENOUS | Status: DC | PRN
  Filled 2020-08-23: qty 10

## 2020-08-23 MED ORDER — LACTATED RINGERS IV SOLN
INTRAVENOUS | Status: DC
  Administered 2020-08-23: 1000 mL via INTRAVENOUS
  Administered 2020-08-23: 125 mL via INTRAVENOUS

## 2020-08-23 MED ORDER — SOD CITRATE-CITRIC ACID 500-334 MG/5ML PO SOLN: 30.0000 mL | ORAL | Status: DC | PRN

## 2020-08-23 MED ORDER — BUTORPHANOL TARTRATE 1 MG/ML IJ SOLN
INTRAMUSCULAR | Status: AC
  Filled 2020-08-23: qty 1

## 2020-08-23 MED ORDER — OXYCODONE-ACETAMINOPHEN 5-325 MG PO TABS: 2.0000 | ORAL_TABLET | ORAL | Status: DC | PRN

## 2020-08-23 MED ORDER — OXYTOCIN-SODIUM CHLORIDE 30-0.9 UT/500ML-% IV SOLN
2.5000 [IU]/h | INTRAVENOUS | Status: DC
  Filled 2020-08-23: qty 500

## 2020-08-23 MED ORDER — OXYCODONE-ACETAMINOPHEN 5-325 MG PO TABS: 1.0000 | ORAL_TABLET | ORAL | Status: DC | PRN

## 2020-08-23 MED ORDER — FENTANYL 2.5 MCG/ML W/ROPIVACAINE 0.15% IN NS 100 ML EPIDURAL (ARMC)
EPIDURAL | Status: AC
  Filled 2020-08-23: qty 100

## 2020-08-23 MED ORDER — ZOLPIDEM TARTRATE 5 MG PO TABS: 5.0000 mg | ORAL_TABLET | Freq: Every evening | ORAL | Status: DC | PRN

## 2020-08-23 MED ORDER — ONDANSETRON HCL 4 MG/2ML IJ SOLN: 4.0000 mg | Freq: Four times a day (QID) | INTRAMUSCULAR | Status: DC | PRN

## 2020-08-23 MED ORDER — EPHEDRINE 5 MG/ML INJ
10.0000 mg | INTRAVENOUS | Status: DC | PRN
  Filled 2020-08-23: qty 2

## 2020-08-23 MED ORDER — SODIUM CHLORIDE 0.9 % IV SOLN
INTRAVENOUS | Status: DC | PRN
  Administered 2020-08-23 (×2): 5 mL via EPIDURAL

## 2020-08-23 MED ORDER — TERBUTALINE SULFATE 1 MG/ML IJ SOLN: 0.2500 mg | Freq: Once | INTRAMUSCULAR | Status: DC | PRN

## 2020-08-23 MED ORDER — AMMONIA AROMATIC IN INHA
RESPIRATORY_TRACT | Status: AC
  Filled 2020-08-23: qty 10

## 2020-08-23 MED ORDER — LACTATED RINGERS IV SOLN: 500.0000 mL | INTRAVENOUS | Status: DC | PRN

## 2020-08-23 MED ORDER — LIDOCAINE HCL (PF) 1 % IJ SOLN
INTRAMUSCULAR | Status: DC | PRN
  Administered 2020-08-23: 1 mL

## 2020-08-23 MED ORDER — LIDOCAINE HCL (PF) 1 % IJ SOLN
30.0000 mL | INTRAMUSCULAR | Status: DC | PRN
  Filled 2020-08-23: qty 30

## 2020-08-23 MED ORDER — OXYTOCIN BOLUS FROM INFUSION
333.0000 mL | Freq: Once | INTRAVENOUS | Status: AC
  Administered 2020-08-23: 333 mL via INTRAVENOUS

## 2020-08-23 MED ORDER — FENTANYL 2.5 MCG/ML W/ROPIVACAINE 0.15% IN NS 100 ML EPIDURAL (ARMC)
12.0000 mL/h | EPIDURAL | Status: DC
Start: 2020-08-23 — End: 2020-08-25
  Administered 2020-08-23 (×2): 12 mL/h via EPIDURAL
  Filled 2020-08-23: qty 100

## 2020-08-23 MED ORDER — MISOPROSTOL 200 MCG PO TABS
ORAL_TABLET | ORAL | Status: AC
  Filled 2020-08-23: qty 4

## 2020-08-23 MED ORDER — OXYTOCIN 10 UNIT/ML IJ SOLN
INTRAMUSCULAR | Status: AC
  Filled 2020-08-23: qty 1

## 2020-08-23 MED ORDER — BUTORPHANOL TARTRATE 1 MG/ML IJ SOLN
1.0000 mg | INTRAMUSCULAR | Status: DC | PRN
Start: 2020-08-23 — End: 2020-08-24
  Administered 2020-08-23: 1 mg via INTRAVENOUS

## 2020-08-23 MED ORDER — OXYTOCIN-SODIUM CHLORIDE 30-0.9 UT/500ML-% IV SOLN
1.0000 m[IU]/min | INTRAVENOUS | Status: DC
  Administered 2020-08-23: 2 m[IU]/min via INTRAVENOUS
  Filled 2020-08-23: qty 500

## 2020-08-23 MED ORDER — DIPHENHYDRAMINE HCL 50 MG/ML IJ SOLN: 12.5000 mg | INTRAMUSCULAR | Status: DC | PRN

## 2020-08-23 MED ORDER — LIDOCAINE-EPINEPHRINE (PF) 1.5 %-1:200000 IJ SOLN
INTRAMUSCULAR | Status: DC | PRN
  Administered 2020-08-23: 3 mL via EPIDURAL

## 2020-08-23 MED ORDER — ACETAMINOPHEN 325 MG PO TABS
650.0000 mg | ORAL_TABLET | ORAL | Status: DC | PRN
Start: 2020-08-23 — End: 2020-08-24

## 2020-08-23 NOTE — Anesthesia Preprocedure Evaluation (Addendum)
Anesthesia Evaluation  Patient identified by MRN, date of birth, ID band Patient awake    Reviewed: Allergy & Precautions, H&P , NPO status , Patient's Chart, lab work & pertinent test results  Airway Mallampati: III  TM Distance: >3 FB Neck ROM: full    Dental  (+) Chipped   Pulmonary neg pulmonary ROS,    Pulmonary exam normal        Cardiovascular Exercise Tolerance: Good negative cardio ROS Normal cardiovascular exam     Neuro/Psych    GI/Hepatic negative GI ROS,   Endo/Other    Renal/GU   negative genitourinary   Musculoskeletal   Abdominal   Peds  Hematology negative hematology ROS (+)   Anesthesia Other Findings Past Medical History: No date: Anxiety No date: Recurrent UTI  Past Surgical History: No date: CESAREAN SECTION  BMI    Body Mass Index: 32.59 kg/m      Reproductive/Obstetrics (+) Pregnancy                             Anesthesia Physical Anesthesia Plan  ASA: II  Anesthesia Plan: Epidural   Post-op Pain Management:    Induction:   PONV Risk Score and Plan:   Airway Management Planned: Natural Airway  Additional Equipment:   Intra-op Plan:   Post-operative Plan:   Informed Consent: I have reviewed the patients History and Physical, chart, labs and discussed the procedure including the risks, benefits and alternatives for the proposed anesthesia with the patient or authorized representative who has indicated his/her understanding and acceptance.     Dental Advisory Given  Plan Discussed with: Anesthesiologist  Anesthesia Plan Comments: (Notes about recent leg weakness and numbness in the patients chart.  Patient reports that these symptoms were related to leg edema and have resolved.   She reports no problems with spinal or epidural with her last pregnancy.  Consented that neuraxial could worsen a neurologic injury or disease which she voiced  understanding to.  Patient reports no bleeding problems and no anticoagulant use.   Patient consented for risks of anesthesia including but not limited to:  - adverse reactions to medications - risk of bleeding, infection and or nerve damage from epidural that could lead to paralysis - risk of headache or failed epidural - nerve damage due to positioning - that if epidural is used for C-section that there is a chance of epidural failure requiring spinal placement or conversion to GA - Damage to heart, brain, lungs, other parts of body or loss of life  Patient voiced understanding.)        Anesthesia Quick Evaluation

## 2020-08-23 NOTE — Anesthesia Procedure Notes (Addendum)
Epidural Patient location during procedure: OB Start time: 08/23/2020 5:36 PM End time: 08/23/2020 5:40 PM  Staffing Anesthesiologist: Courteny Egler, Precious Haws, MD Performed: anesthesiologist   Preanesthetic Checklist Completed: patient identified, IV checked, site marked, risks and benefits discussed, surgical consent, monitors and equipment checked, pre-op evaluation and timeout performed  Epidural Patient position: sitting Prep: ChloraPrep Patient monitoring: heart rate, continuous pulse ox and blood pressure Approach: midline Location: L3-L4 Injection technique: LOR saline  Needle:  Needle type: Tuohy  Needle gauge: 17 G Needle length: 9 cm and 9 Needle insertion depth: 5 cm Catheter type: closed end flexible Catheter size: 19 Gauge Catheter at skin depth: 10 cm Test dose: negative and 1.5% lidocaine with Epi 1:200 K  Assessment Sensory level: T10 Events: blood not aspirated, injection not painful, no injection resistance, no paresthesia and negative IV test  Additional Notes 1 attempt Patient has dermal cyst on lumbar spin in the thoracic region, non inflamed, no erythyema.   Epidural insertion site was at least 10 cm from the location of this cyst.   Pt. Evaluated and documentation done after procedure finished. Patient identified. Risks/Benefits/Options discussed with patient including but not limited to bleeding, infection, nerve damage, paralysis, failed block, incomplete pain control, headache, blood pressure changes, nausea, vomiting, reactions to medication both or allergic, itching and postpartum back pain. Confirmed with bedside nurse the patient's most recent platelet count. Confirmed with patient that they are not currently taking any anticoagulation, have any bleeding history or any family history of bleeding disorders. Patient expressed understanding and wished to proceed. All questions were answered. Sterile technique was used throughout the entire procedure.  Please see nursing notes for vital signs. Test dose was given through epidural catheter and negative prior to continuing to dose epidural or start infusion. Warning signs of high block given to the patient including shortness of breath, tingling/numbness in hands, complete motor block, or any concerning symptoms with instructions to call for help. Patient was given instructions on fall risk and not to get out of bed. All questions and concerns addressed with instructions to call with any issues or inadequate analgesia.   Patient tolerated the insertion well without immediate complications.Reason for block:procedure for pain

## 2020-08-23 NOTE — Progress Notes (Signed)
LABOR NOTE   Tiffany Franco 35 y.o.@ at [redacted]w[redacted]d  SUBJECTIVE:  Having irregular ctx, pitocin induction starting now.  Analgesia: IV pain meds  OBJECTIVE:  BP 125/77   Pulse 81   Temp 98.3 F (36.8 C) (Oral)   Resp 18   Ht 5\' 3"  (1.6 m)   Wt 83.5 kg   LMP 11/15/2019   BMI 32.59 kg/m  No intake/output data recorded.  She has shown cervical change. CERVIX: 3 cm:  60:   -2:   posterior:   soft SVE:   Dilation: 3 Effacement (%): 60 Station: -2 Exam by:: Grandville Silos CNM CONTRACTIONS: irregular, every 2-5 minutes FHR: Fetal heart tracing reviewed. Baseline: 140 bpm, Variability: Good {> 6 bpm), Accelerations: Reactive and Decelerations: Absent Category I    Labs: Lab Results  Component Value Date   WBC 9.5 08/23/2020   HGB 11.3 (L) 08/23/2020   HCT 34.4 (L) 08/23/2020   MCV 89.6 08/23/2020   PLT 219 08/23/2020    ASSESSMENT: 1) Labor curve reviewed.       Progress: Not in labor.     Membranes: intact            Active Problems:   Encounter for elective induction of labor  PLAN: IV Pitocin induction TOLAC policy activated  Philip Aspen, CNM  08/23/2020 1:14 PM

## 2020-08-23 NOTE — Progress Notes (Signed)
LABOR NOTE   Tiffany Franco 35 y.o.@ at [redacted]w[redacted]d  SUBJECTIVE:  Having right sided pain, epidural bolus given. PT turned to right side. Anesthesia notified.  Analgesia: Epidural  OBJECTIVE:  BP 127/78   Pulse 79   Temp (P) 98.4 F (36.9 C) (Oral)   Resp 20   Ht 5\' 3"  (1.6 m)   Wt 83.5 kg   LMP 11/15/2019   SpO2 95%   BMI 32.59 kg/m  No intake/output data recorded.  She has shown cervical change. CERVIX: per RN exam  SVE:   Dilation: 5 Effacement (%): 80 Station: -2 Exam by:: RS CONTRACTIONS: regular, every 2 minutes FHR: Fetal heart tracing reviewed. Baseline: 120 bpm, Variability: Good {> 6 bpm), Accelerations: Non-reactive but appropriate for gestational age and Decelerations: Absent Category I    Labs: Lab Results  Component Value Date   WBC 9.5 08/23/2020   HGB 11.3 (L) 08/23/2020   HCT 34.4 (L) 08/23/2020   MCV 89.6 08/23/2020   PLT 219 08/23/2020    ASSESSMENT: 1) Labor curve reviewed.       Progress: Active phase labor.     Membranes: ruptured, clear fluid          Active Problems:   Encounter for elective induction of labor   PLAN: continue present management and IV Pitocin induction   Philip Aspen, CNM  08/23/2020 9:10 PM

## 2020-08-23 NOTE — Progress Notes (Signed)
LABOR NOTE   Tiffany Franco 34 y.o.@ at [redacted]w[redacted]d  SUBJECTIVE:  Comfortable with epidural  Analgesia: Epidural  OBJECTIVE:  BP 132/82   Pulse 98   Temp 98.2 F (36.8 C) (Oral)   Resp 20   Ht 5\' 3"  (1.6 m)   Wt 83.5 kg   LMP 11/15/2019   BMI 32.59 kg/m  No intake/output data recorded.  She has shown cervical change. CERVIX: 4 cm:  80%:   -2:   mid position:   soft SVE:   Dilation: 4 Effacement (%): 80 Station: -2 Exam by:: THompson CNM CONTRACTIONS: regular, every 2-3 minutes FHR: Fetal heart tracing reviewed. Baseline: 125 bpm, Variability: Good {> 6 bpm), Accelerations: Reactive and Decelerations: Absent Category I   Labs: Lab Results  Component Value Date   WBC 9.5 08/23/2020   HGB 11.3 (L) 08/23/2020   HCT 34.4 (L) 08/23/2020   MCV 89.6 08/23/2020   PLT 219 08/23/2020    ASSESSMENT: 1) Labor curve reviewed.       Progress: AROM and Early latent labor.     Membranes: ruptured, clear fluid           Active Problems:   Encounter for elective induction of labor   PLAN: continue present management and IV Pitocin induction   Philip Aspen, CNM  08/23/2020 6:18 PM

## 2020-08-23 NOTE — Progress Notes (Signed)
LABOR NOTE   Tiffany Franco 34 y.o.@ at [redacted]w[redacted]d  SUBJECTIVE:  Feeling more pressure and back pain. Analgesia: Epidural  OBJECTIVE:  BP 127/78   Pulse 79   Temp (P) 98.4 F (36.9 C) (Oral)   Resp 20   Ht 5\' 3"  (1.6 m)   Wt 83.5 kg   LMP 11/15/2019   SpO2 95%   BMI 32.59 kg/m  No intake/output data recorded.  She has shown cervical change. CERVIX: 8 cm/90%/-1 SVE:   Dilation: 8 Effacement (%): 90 Station: 0 Exam by:: Grandville Silos, CNM CONTRACTIONS: regular, every 2 minutes FHR: Fetal heart tracing reviewed. Baseline: 145 bpm, Variability: Good {> 6 bpm), Accelerations: Reactive and Decelerations: Absent Category I   Labs: Lab Results  Component Value Date   WBC 9.5 08/23/2020   HGB 11.3 (L) 08/23/2020   HCT 34.4 (L) 08/23/2020   MCV 89.6 08/23/2020   PLT 219 08/23/2020    ASSESSMENT: 1) Labor curve reviewed.       Progress: Active phase labor.     Membranes: ruptured, clear fluid           Active Problems:   Encounter for elective induction of labor   PLAN: continue present management. Pt repositioned to high fowlers for comfort.    Philip Aspen, CNM 08/23/2020 10:36 PM

## 2020-08-23 NOTE — H&P (Signed)
History and Physical   HPI  Tiffany Franco is a 35 y.o. G3P2003 at [redacted]w[redacted]d Estimated Date of Delivery: 08/21/20 who is being admitted for induction of labor due to post dates.    OB History  OB History  Gravida Para Term Preterm AB Living  3 2 2  0 0 3  SAB IAB Ectopic Multiple Live Births  0 0 0 1 3    # Outcome Date GA Lbr Len/2nd Weight Sex Delivery Anes PTL Lv  3 Current           2A Term 05/11/16   3232 g F CS-LTranv   LIV  2B Term 05/11/16   3402 g M Vag-Spont   LIV  1 Term 03/03/10   3402 g F Vag-Spont   LIV    PROBLEM LIST  Pregnancy complications or risks: Patient Active Problem List   Diagnosis Date Noted  . Encounter for elective induction of labor 08/23/2020  . Labor and delivery, indication for care 08/15/2020  . Indication for care in labor and delivery, antepartum 08/11/2020  . Desires VBAC (vaginal birth after cesarean) trial 07/16/2020  . Encounter for supervision of high-risk pregnancy with multigravida of advanced maternal age 64/06/2019  . History of cesarean section, low vertical 02/27/2020  . Family history of fraternal twins 02/27/2020  . History of fourth degree perineal laceration 02/27/2020    Prenatal labs and studies: ABO, Rh: --/--/O POS (02/26 9242) Antibody: NEG (02/26 6834) Rubella: 2.78 (08/18 1425) RPR: Non Reactive (12/10 1020)  HBsAg: Negative (08/18 1425)  HIV:    HDQ:QIWLNLGX/-- (02/03 1010)   Past Medical History:  Diagnosis Date  . Anxiety   . Recurrent UTI      Past Surgical History:  Procedure Laterality Date  . CESAREAN SECTION       Medications    Current Discharge Medication List    CONTINUE these medications which have NOT CHANGED   Details  Prenatal Vit-Fe Fumarate-FA (PRENATAL PO) Take by mouth.         Allergies  Patient has no known allergies.  Review of Systems  Constitutional: negative Eyes: negative Ears, nose, mouth, throat, and face: negative Respiratory:  negative Cardiovascular: negative Gastrointestinal: negative Genitourinary:negative Integument/breast: negative Hematologic/lymphatic: negative Musculoskeletal:negative Neurological: negative Behavioral/Psych: negative Endocrine: negative Allergic/Immunologic: negative  Physical Exam  BP 120/77 (BP Location: Left Arm)   Pulse 71   Temp 98.1 F (36.7 C) (Oral)   Resp 16   Ht 5\' 3"  (1.6 m)   Wt 83.5 kg   LMP 11/15/2019   BMI 32.59 kg/m   Lungs:  CTA B Cardio: RRR Abd: Soft, gravid, NT Presentation: cephalic EXT: No C/C/ 1+ Edema DTRs: 2+ B CERVIX: Dilation: 2 Effacement (%): 60 Cervical Position: Middle Station: -2 Presentation: Vertex Exam by:: Grandville Silos, CNM  See Prenatal records for more detailed PE.     FHR:  Baseline: 120 bpm, Variability: Good {> 6 bpm), Accelerations: Reactive and Decelerations: Absent  Toco: Uterine Contractions: Frequency: Every 4-5 minutes and Intensity: mild  Test Results  Results for orders placed or performed during the hospital encounter of 08/23/20 (from the past 24 hour(s))  CBC     Status: Abnormal   Collection Time: 08/23/20  6:29 AM  Result Value Ref Range   WBC 9.5 4.0 - 10.5 K/uL   RBC 3.84 (L) 3.87 - 5.11 MIL/uL   Hemoglobin 11.3 (L) 12.0 - 15.0 g/dL   HCT 34.4 (L) 36.0 - 46.0 %   MCV 89.6  80.0 - 100.0 fL   MCH 29.4 26.0 - 34.0 pg   MCHC 32.8 30.0 - 36.0 g/dL   RDW 13.7 11.5 - 15.5 %   Platelets 219 150 - 400 K/uL   nRBC 0.0 0.0 - 0.2 %  Type and screen     Status: None   Collection Time: 08/23/20  6:29 AM  Result Value Ref Range   ABO/RH(D) O POS    Antibody Screen NEG    Sample Expiration      08/26/2020,2359 Performed at [facility], [place], [place] [address]    Group B Strep negative  Assessment   G3P2003 at [redacted]w[redacted]d Estimated Date of Delivery: 08/21/20  The fetus is reassuring.   Patient Active Problem List   Diagnosis Date Noted  . Encounter for elective induction of  labor 08/23/2020  . Labor and delivery, indication for care 08/15/2020  . Indication for care in labor and delivery, antepartum 08/11/2020  . Desires VBAC (vaginal birth after cesarean) trial 07/16/2020  . Encounter for supervision of high-risk pregnancy with multigravida of advanced maternal age 75/06/2019  . History of cesarean section, low vertical 02/27/2020  . Family history of fraternal twins 02/27/2020  . History of fourth degree perineal laceration 02/27/2020    Plan  1. Admit to L&D :   2. EFM:-- Category 1 3. Stadol or Epidural if desired.   4. Admission labs completed. 5. TOLAC 6.Dr. Marcelline Mates aware of admission  Philip Aspen, CNM . 08/23/2020 8:01 AM

## 2020-08-24 ENCOUNTER — Encounter: Payer: Self-pay | Admitting: Certified Nurse Midwife

## 2020-08-24 DIAGNOSIS — O09523 Supervision of elderly multigravida, third trimester: Secondary | ICD-10-CM | POA: Diagnosis not present

## 2020-08-24 DIAGNOSIS — Z3A40 40 weeks gestation of pregnancy: Secondary | ICD-10-CM | POA: Diagnosis not present

## 2020-08-24 DIAGNOSIS — O480 Post-term pregnancy: Secondary | ICD-10-CM | POA: Diagnosis not present

## 2020-08-24 DIAGNOSIS — O34211 Maternal care for low transverse scar from previous cesarean delivery: Secondary | ICD-10-CM

## 2020-08-24 DIAGNOSIS — R072 Precordial pain: Secondary | ICD-10-CM | POA: Diagnosis not present

## 2020-08-24 LAB — CBC
HCT: 28.4 % — ABNORMAL LOW (ref 36.0–46.0)
Hemoglobin: 9.3 g/dL — ABNORMAL LOW (ref 12.0–15.0)
MCH: 29.5 pg (ref 26.0–34.0)
MCHC: 32.7 g/dL (ref 30.0–36.0)
MCV: 90.2 fL (ref 80.0–100.0)
Platelets: 196 10*3/uL (ref 150–400)
RBC: 3.15 MIL/uL — ABNORMAL LOW (ref 3.87–5.11)
RDW: 13.7 % (ref 11.5–15.5)
WBC: 17.6 10*3/uL — ABNORMAL HIGH (ref 4.0–10.5)
nRBC: 0 % (ref 0.0–0.2)

## 2020-08-24 LAB — RPR: RPR Ser Ql: NONREACTIVE

## 2020-08-24 MED ORDER — ONDANSETRON HCL 4 MG/2ML IJ SOLN: 4.0000 mg | INTRAMUSCULAR | Status: DC | PRN

## 2020-08-24 MED ORDER — ACETAMINOPHEN 325 MG PO TABS
650.0000 mg | ORAL_TABLET | ORAL | Status: DC | PRN
  Administered 2020-08-24 (×2): 650 mg via ORAL
  Filled 2020-08-24 (×2): qty 2

## 2020-08-24 MED ORDER — COCONUT OIL OIL
1.0000 "application " | TOPICAL_OIL | Status: DC | PRN
  Administered 2020-08-25: 1 via TOPICAL
  Filled 2020-08-24: qty 120

## 2020-08-24 MED ORDER — SENNOSIDES-DOCUSATE SODIUM 8.6-50 MG PO TABS
2.0000 | ORAL_TABLET | ORAL | Status: DC
  Administered 2020-08-24 – 2020-08-25 (×2): 2 via ORAL
  Filled 2020-08-24 (×2): qty 2

## 2020-08-24 MED ORDER — DIBUCAINE (PERIANAL) 1 % EX OINT: 1.0000 "application " | TOPICAL_OINTMENT | CUTANEOUS | Status: DC | PRN

## 2020-08-24 MED ORDER — ONDANSETRON HCL 4 MG PO TABS: 4.0000 mg | ORAL_TABLET | ORAL | Status: DC | PRN

## 2020-08-24 MED ORDER — IBUPROFEN 600 MG PO TABS
600.0000 mg | ORAL_TABLET | Freq: Four times a day (QID) | ORAL | Status: DC
  Administered 2020-08-24 – 2020-08-25 (×3): 600 mg via ORAL
  Filled 2020-08-24 (×3): qty 1

## 2020-08-24 MED ORDER — MEASLES, MUMPS & RUBELLA VAC IJ SOLR
0.5000 mL | Freq: Once | INTRAMUSCULAR | Status: DC
  Filled 2020-08-24: qty 0.5

## 2020-08-24 MED ORDER — SIMETHICONE 80 MG PO CHEW: 80.0000 mg | CHEWABLE_TABLET | ORAL | Status: DC | PRN

## 2020-08-24 MED ORDER — IBUPROFEN 600 MG PO TABS
600.0000 mg | ORAL_TABLET | Freq: Four times a day (QID) | ORAL | Status: DC
  Administered 2020-08-24 (×2): 600 mg via ORAL
  Filled 2020-08-24 (×2): qty 1

## 2020-08-24 MED ORDER — OXYCODONE-ACETAMINOPHEN 5-325 MG PO TABS: 2.0000 | ORAL_TABLET | ORAL | Status: DC | PRN

## 2020-08-24 MED ORDER — PRENATAL MULTIVITAMIN CH
1.0000 | ORAL_TABLET | Freq: Every day | ORAL | Status: DC
  Administered 2020-08-24 – 2020-08-25 (×2): 1 via ORAL
  Filled 2020-08-24 (×2): qty 1

## 2020-08-24 MED ORDER — DOCUSATE SODIUM 100 MG PO CAPS: 100.0000 mg | ORAL_CAPSULE | Freq: Two times a day (BID) | ORAL | Status: DC

## 2020-08-24 MED ORDER — FERROUS SULFATE 325 (65 FE) MG PO TABS
325.0000 mg | ORAL_TABLET | Freq: Every day | ORAL | Status: DC
  Administered 2020-08-24 – 2020-08-25 (×2): 325 mg via ORAL
  Filled 2020-08-24 (×2): qty 1

## 2020-08-24 MED ORDER — WITCH HAZEL-GLYCERIN EX PADS: 1.0000 "application " | MEDICATED_PAD | CUTANEOUS | Status: DC | PRN

## 2020-08-24 MED ORDER — OXYCODONE-ACETAMINOPHEN 5-325 MG PO TABS
1.0000 | ORAL_TABLET | ORAL | Status: DC | PRN
Start: 2020-08-24 — End: 2020-08-25

## 2020-08-24 MED ORDER — BENZOCAINE-MENTHOL 20-0.5 % EX AERO: 1.0000 "application " | INHALATION_SPRAY | CUTANEOUS | Status: DC | PRN

## 2020-08-24 NOTE — Lactation Note (Signed)
This note was copied from a baby's chart. Lactation Consultation Note  Patient Name: Tiffany Franco Today's Date: 08/24/2020 Reason for consult: Initial assessment Age:36 hours  Lactation to the room for initial visit. Mother is holding the baby in cradle on the right. Baby was latched and feeding.  Encouraged feeding on demand and with cues. If baby is not cueing encouraged hand expression and skin to skin.  Encouraged 8 or more attempts in the first 24 hours and 8 or more good feeds after 24 HOL. Discussed booklet available in folder with information about breastfeeding.  Mother is hoping to breastfeed longer with this baby. When [place] spoke with her in the hall Mother stated baby is latching better than any of her other children.  Mother has no questions at this time.   Maternal Data Does the patient have breastfeeding experience prior to this delivery?: Yes How long did the patient breastfeed?: 3 mo and 6 months(twins)  Feeding Mother's Current Feeding Choice: Breast Milk  Interventions Interventions: Breast feeding basics reviewed;Education  Discharge Pump:  (has ordered pump)  Consult Status Consult Status: PRN    April D Ruhl 08/24/2020, 5:36 PM

## 2020-08-24 NOTE — Plan of Care (Signed)
Pt. Successfully progressed through all stages of labor and delivered healthy baby girl. She and spouse are bonding appropriately with baby.

## 2020-08-24 NOTE — Progress Notes (Signed)
Progress Note - Vaginal Delivery  Tiffany Franco is a 35 y.o. G3P3004 now PP day 1 s/p Vaginal, Spontaneous .   Subjective:  The patient reports no complaints, up ad lib, voiding, tolerating PO and + flatus   Objective:  Vital signs in last 24 hours: Temp:  [98 F (36.7 C)-98.7 F (37.1 C)] 98 F (36.7 C) (02/27 0828) Pulse Rate:  [70-98] 77 (02/27 0828) Resp:  [18-20] 20 (02/27 0828) BP: (100-132)/(57-82) 100/66 (02/27 0828) SpO2:  [94 %-99 %] 99 % (02/27 0828)  Physical Exam:  General: alert, cooperative, appears stated age, fatigued and no distress Lochia: appropriate Uterine Fundus: firm    Data Review Recent Labs    08/23/20 0629 08/24/20 0425  HGB 11.3* 9.3*  HCT 34.4* 28.4*    Assessment/Plan: Active Problems:   Previous cesarean section   Encounter for elective induction of labor   Plan for discharge tomorrow and Breastfeeding   -- Continue routine PP care.     Philip Aspen, CNM  08/24/2020 10:55 AM

## 2020-08-24 NOTE — Anesthesia Postprocedure Evaluation (Signed)
Anesthesia Post Note  Patient: Nature conservation officer  Procedure(s) Performed: AN AD HOC LABOR EPIDURAL  Patient location during evaluation: Mother Baby Anesthesia Type: Epidural Level of consciousness: awake and alert and oriented Pain management: pain level controlled Vital Signs Assessment: post-procedure vital signs reviewed and stable Respiratory status: spontaneous breathing Cardiovascular status: blood pressure returned to baseline Postop Assessment: no headache and no backache Anesthetic complications: no   No complications documented.   Last Vitals:  Vitals:   08/24/20 0828 08/24/20 1234  BP: 100/66 101/68  Pulse: 77 83  Resp: 20 18  Temp: 36.7 C 36.9 C  SpO2: 99% 99%    Last Pain:  Vitals:   08/24/20 0730  TempSrc:   PainSc: 0-No pain                 CARROLL,PAUL

## 2020-08-24 NOTE — Progress Notes (Signed)
Progress Note - Vaginal Delivery  Tiffany Franco is a 34 y.o. G3P3004 now PP day 1 s/psucessful VBAC- Vaginal, Spontaneous .  Subjective: RN called to inform me that pt was c/o chest pressure/ discomfort. Nurse says that the patient denies SOB, or feeling dizzy/light headed. The nurse states pulse ox normal, BP normal, heart rate normal, and bleeding stable.   Objective:  Vital signs in last 24 hours: Temp:  [97.8 F (36.6 C)-98.7 F (37.1 C)] 97.8 F (36.6 C) (02/27 2035) Pulse Rate:  [70-94] 79 (02/27 2035) Resp:  [18-20] 20 (02/27 2035) BP: (100-120)/(57-78) 120/78 (02/27 2035) SpO2:  [94 %-100 %] 100 % (02/27 2035)  Physical Exam: per RN General: alert and cooperative Lochia: appropriate Uterine Fundus: firm    Data Review Recent Labs    08/23/20 0629 08/24/20 0425  HGB 11.3* 9.3*  HCT 34.4* 28.4*    Assessment/Plan: Active Problems:   Previous cesarean section   Encounter for elective induction of labor   EKG ordered- preliminary results Normal sinus rhythm  Dr.Cherry consulted.   Will continue to monitor pt.  -- Continue routine PP care.     Philip Aspen, CNM 08/24/2020 10:30 PM

## 2020-08-25 MED ORDER — ACETAMINOPHEN 500 MG PO TABS: 1000.0000 mg | ORAL_TABLET | Freq: Four times a day (QID) | ORAL | 1 refills | Status: DC | PRN

## 2020-08-25 MED ORDER — SENNOSIDES-DOCUSATE SODIUM 8.6-50 MG PO TABS: 2.0000 | ORAL_TABLET | ORAL | 0 refills | Status: DC

## 2020-08-25 MED ORDER — BENZOCAINE-MENTHOL 20-0.5 % EX AERO: 1.0000 "application " | INHALATION_SPRAY | CUTANEOUS | 1 refills | Status: DC | PRN

## 2020-08-25 MED ORDER — FERROUS GLUCONATE 240 (27 FE) MG PO TABS: 240.0000 mg | ORAL_TABLET | Freq: Every day | ORAL | 1 refills | Status: DC

## 2020-08-25 MED ORDER — WITCH HAZEL-GLYCERIN EX PADS: 1.0000 "application " | MEDICATED_PAD | CUTANEOUS | 12 refills | Status: DC | PRN

## 2020-08-25 MED ORDER — IBUPROFEN 600 MG PO TABS: 600.0000 mg | ORAL_TABLET | Freq: Four times a day (QID) | ORAL | 0 refills | Status: DC

## 2020-08-25 NOTE — Discharge Summary (Signed)
Obstetric Discharge Summary  Patient ID: Tiffany Franco MRN: [id number] DOB/AGE: January 26, 1986 35 y.o.   Date of Admission: 08/23/2020  Date of Discharge:  08/25/20  Admitting Diagnosis: Induction of labor at [redacted]w[redacted]d  Secondary Diagnosis:   Patient Active Problem List   Diagnosis Date Noted  . Encounter for elective induction of labor 08/23/2020  . Labor and delivery, indication for care 08/15/2020  . Indication for care in labor and delivery, antepartum 08/11/2020  . Desires VBAC (vaginal birth after cesarean) trial 07/16/2020  . Encounter for supervision of high-risk pregnancy with multigravida of advanced maternal age 90/06/2019  . Previous cesarean section 02/27/2020  . Family history of fraternal twins 02/27/2020  . History of fourth degree perineal laceration 02/27/2020     Mode of Delivery: normal spontaneous vaginal delivery     Discharge Diagnosis: No other diagnosis   Intrapartum Procedures: Atificial rupture of membranes, epidural and pitocin augmentation   Post partum procedures: None  Complications: Unknown degree perineal laceration, repaired   Brief Hospital Course   Tiffany Franco is a [id number] who had a spontaneous vaginal birth after cesarean section on 08/23/2020;  for further details of this birth, please refer to the delivey summary.  Patient had an uncomplicated postpartum course.  By time of discharge on PPD#2, her pain was controlled on oral pain medications; she had appropriate lochia and was ambulating, voiding without difficulty and tolerating regular diet.  She was deemed stable for discharge to home.    Labs: CBC Latest Ref Rng & Units 08/24/2020 08/23/2020 06/06/2020  WBC 4.0 - 10.5 K/uL 17.6(H) 9.5 9.7  Hemoglobin 12.0 - 15.0 g/dL 9.3(L) 11.3(L) 11.3  Hematocrit 36.0 - 46.0 % 28.4(L) 34.4(L) 32.1(L)  Platelets 150 - 400 K/uL 196 219 243   O POS Performed at [facility], [address] [place], [place] [address]   Physical exam:    Temp:  [97.8 F (36.6 C)-98.6 F (37 C)] 98.5 F (36.9 C) (02/28 0813) Pulse Rate:  [68-94] 79 (02/28 0813) Resp:  [18-20] 20 (02/28 0813) BP: (101-120)/(67-78) 104/69 (02/28 0813) SpO2:  [97 %-100 %] 100 % (02/28 0813)  General: alert and no distress  Lochia: appropriate  Abdomen: soft, NT  Uterine Fundus: firm  Perineum: healing well, no significant drainage, no dehiscence, no significant erythema  Extremities: No evidence of DVT seen on physical exam.   Edinburgh Postnatal Depression Scale Screening Tool 08/25/2020 08/24/2020  I have been able to laugh and see the funny side of things. 0 (No Data)  I have looked forward with enjoyment to things. 0 -  I have blamed myself unnecessarily when things went wrong. 0 -  I have been anxious or worried for no good reason. 2 -  I have felt scared or panicky for no good reason. 0 -  Things have been getting on top of me. 1 -  I have been so unhappy that I have had difficulty sleeping. 0 -  I have felt sad or miserable. 1 -  I have been so unhappy that I have been crying. 0 -  The thought of harming myself has occurred to me. 0 -  Edinburgh Postnatal Depression Scale Total 4 -      Discharge Instructions: Per After Visit Summary.  Activity: Advance as tolerated. Pelvic rest for 6 weeks.  Also refer to After Visit Summary  Diet: Regular  Medications: Allergies as of 08/25/2020   No Known Allergies     Medication List    TAKE these  medications   acetaminophen 500 MG tablet Commonly known as: TYLENOL Take 2 tablets (1,000 mg total) by mouth every 6 (six) hours as needed for mild pain or moderate pain.   benzocaine-Menthol 20-0.5 % Aero Commonly known as: DERMOPLAST Apply 1 application topically as needed for irritation (perineal discomfort).   ferrous gluconate 240 (27 FE) MG tablet Commonly known as: FERGON Take 1 tablet (240 mg total) by mouth daily with breakfast.   ibuprofen 600 MG tablet Commonly known as:  ADVIL Take 1 tablet (600 mg total) by mouth every 6 (six) hours.   PRENATAL PO Take by mouth.   senna-docusate 8.6-50 MG tablet Commonly known as: Senokot-S Take 2 tablets by mouth daily. Start taking on: August 26, 2020   witch hazel-glycerin pad Commonly known as: TUCKS Apply 1 application topically as needed for hemorrhoids.      Outpatient follow up:   Follow-up Information    Philip Aspen, CNM Follow up.   Specialties: Certified Nurse Midwife, Radiology Why: Please call the office to schedule two (2) week TELEVISIT and six (6) week POSTPARTUM VISIT Contact information: [address] [place] [address] [phone]              Postpartum contraception: Vasectomy  Discharged Condition: stable  Discharged to: home   Newborn Data:  Disposition:home with mother  Apgars: APGAR (1 MIN): 8   APGAR (5 MINS): 9      Baby Feeding: Breast   Dani Gobble, CNM  [facility], [facility] 08/25/20 12:11 PM

## 2020-08-25 NOTE — Discharge Instructions (Signed)
Please call your doctor or return to the ER if you experience any chest pains, shortness of breath, dizziness, visual changes, severe headache (unrelieved by pain meds), fever greater than 100.4, any heavy bleeding (saturating more than 1 pad per hour), large clots, or foul smelling discharge, any worsening abdominal pain and cramping that is not controlled by pain medication, any calf/leg pain or redness, any breast concerns (redness/pain), or any signs of postpartum depression. No tampons, enemas, douches, or sexual intercourse for 6 weeks. Also avoid tub baths, hot tubs, or swimming for 6 weeks.

## 2020-08-25 NOTE — Progress Notes (Addendum)
Post Partum Day 2 Subjective: no complaints, up ad lib, voiding, tolerating PO, + flatus and had BM yesterday without difficulty   Patient up in chair, Husband in bed holding infant on chest. Reports some soreness to bottom area, no longer using ice pads.   Denies difficulty breathing, respiratory distress, chest pain, nipple soreness, excessive vaginal bleeding, and leg pain.  Objective: Blood pressure 104/69, pulse 79, temperature 98.5 F (36.9 C), temperature source Oral, resp. rate 20, height 5\' 3"  (1.6 m), weight 83.5 kg, last menstrual period 11/15/2019, SpO2 100 %, unknown if currently breastfeeding.  Physical Exam:  General: alert, cooperative, appears stated age and no distress Lochia: appropriate Uterine Fundus: firm Incision: n/a DVT Evaluation: No evidence of DVT seen on physical exam. Negative Homan's sign. No cords or calf tenderness. No significant calf/ankle edema. Trace amount  Recent Labs    08/23/20 0629 08/24/20 0425  HGB 11.3* 9.3*  HCT 34.4* 28.4*    Assessment/Plan: Discharge home and Breastfeeding   RX for motrin, see orders  Encouraged to increase fluid intake and take pain medication as needed.  Reviewed red flag symptoms and when to call.  RTC x 2 week TELEVISIT and 6 week POSTPARTUM exam or sooner if needed.    LOS: 2 days   Daneil Dan, [employer] 08/25/2020, 12:38 PM

## 2020-08-25 NOTE — Lactation Note (Signed)
This note was copied from a baby's chart. Lactation Consultation Note  Patient Name: Tiffany Franco Today's Date: 08/25/2020   Age:36 hours  Maternal Data Has patient been taught Hand Expression?: Yes Does the patient have breastfeeding experience prior to this delivery?: Yes     Lactation to patient room for follow-up visit. Patient states that they ran into a "hiccup" last night when baby was cluster feeding. Patient said that the baby did not seem to be getting full because her milk had not "come in". Lactation student explained to the patient that cluster feeding is normal and that her colostrum is milk, but that her milk will change in consistency and increase in volume over the next few days.   Lactation student reviewed watching feeding cues, feeding baby on demand, cluster feeding, supply and demand, breast care, engorgement vs fullness, pumping basics, milk storage guidelines, support groups, outpatient information, PACE bottle feeding, and avoiding the use of bottles or artificial nipples until baby is 34-75 weeks old.   Patient was encouraged to use ice rather than heat if she experienced swelling and to call lactation if she needed assistance.   Plan  Patient will latch baby 8 or more times per day in 24 hours per baby's feeding cues.   Patient will continue to do lots of skin-to-skin with baby.   Patient should watch baby's output and call a pediatrician if needed.       Tiffany Franco 08/25/2020, 11:52 AM

## 2020-08-25 NOTE — Progress Notes (Signed)
Discharge order received from doctor. Reviewed discharge instructions and prescriptions with patient and answered all questions. Follow up appointment instructions given. Patient verbalized understanding. ID bands checked. Patient discharged home with infant via wheelchair by nursing/auxillary.  

## 2020-09-10 ENCOUNTER — Telehealth: Payer: Self-pay | Admitting: Certified Nurse Midwife

## 2020-09-10 NOTE — Telephone Encounter (Signed)
LM for pt to call office to schedule 6 week ppv.

## 2020-09-29 ENCOUNTER — Encounter: Payer: Medicaid Other | Admitting: Certified Nurse Midwife

## 2020-10-06 ENCOUNTER — Telehealth: Payer: Self-pay

## 2020-10-06 ENCOUNTER — Ambulatory Visit (INDEPENDENT_AMBULATORY_CARE_PROVIDER_SITE_OTHER): Payer: Medicaid Other | Admitting: Certified Nurse Midwife

## 2020-10-06 ENCOUNTER — Encounter: Payer: Self-pay | Admitting: Certified Nurse Midwife

## 2020-10-06 ENCOUNTER — Other Ambulatory Visit: Payer: Self-pay

## 2020-10-06 MED ORDER — NORETHINDRONE 0.35 MG PO TABS: 1.0000 | ORAL_TABLET | Freq: Every day | ORAL | 11 refills | Status: DC

## 2020-10-06 NOTE — Telephone Encounter (Signed)
Pt called no answer LM via VM that AT had an appt opening at 2pm this evenign and wanted to know if she wanted to come in at 2pm instead of 3:45pm. Pt is aware that if she is not able to do the 2pm that she can keep her 3:45pm appt.

## 2020-10-06 NOTE — Progress Notes (Signed)
Subjective:    Ronnika Gaskins is a 35 y.o. G23P3004 Caucasian female who presents for a postpartum visit. She is 6 weeks postpartum following a vaginal birth after cesarean (VBAC) at term.. Anesthesia: epidural. I have fully reviewed the prenatal and intrapartum course. Postpartum course has been normal. Baby's course has been normal. Baby is feeding by breast. Bleeding no bleeding. Bowel function is normal. Bladder function is normal. Patient is not sexually active. Last sexual activity: prior to deliveyr Contraception method is oral progesterone-only contraceptive. Postpartum depression screening: negative. Score 1.  Last pap 12/12/2019 and was normal.  The following portions of the patient's history were reviewed and updated as appropriate: allergies, current medications, past medical history, past surgical history and problem list.  Review of Systems Pertinent items are noted in HPI.   Vitals:   10/06/20 1557  BP: 112/72  Pulse: 62  Weight: 158 lb 3.2 oz (71.8 kg)  Height: 5\' 3"  (1.6 m)   No LMP recorded.  Objective:   General:  alert, cooperative and no distress   Breasts:  deferred, no complaints  Lungs: clear to auscultation bilaterally  Heart:  regular rate and rhythm  Abdomen: soft, nontender   Vulva: normal  Vagina: normal vagina  Cervix:  closed  Corpus: Well-involuted  Adnexa:  Non-palpable  Rectal Exam: no hemorrhoids        Assessment:   Postpartum exam 6 wks s/p VBAC Breast feeding Depression screening Contraception counseling   Plan:  : oral progesterone-only contraceptive Follow up in: 6 months for annual exam  or earlier if needed  Philip Aspen, CNM

## 2020-10-06 NOTE — Progress Notes (Signed)
PPV-PT present for postpartum visit. Pt is currently breastfeeding, no issue, would like OCP until her husband has a vasectomy.

## 2020-10-06 NOTE — Patient Instructions (Signed)
Preventive Care 21-36 Years Old, Female Preventive care refers to lifestyle choices and visits with your health care provider that can promote health and wellness. This includes:  A yearly physical exam. This is also called an annual wellness visit.  Regular dental and eye exams.  Immunizations.  Screening for certain conditions.  Healthy lifestyle choices, such as: ? Eating a healthy diet. ? Getting regular exercise. ? Not using drugs or products that contain nicotine and tobacco. ? Limiting alcohol use. What can I expect for my preventive care visit? Physical exam Your health care provider may check your:  Height and weight. These may be used to calculate your BMI (body mass index). BMI is a measurement that tells if you are at a healthy weight.  Heart rate and blood pressure.  Body temperature.  Skin for abnormal spots. Counseling Your health care provider may ask you questions about your:  Past medical problems.  Family's medical history.  Alcohol, tobacco, and drug use.  Emotional well-being.  Home life and relationship well-being.  Sexual activity.  Diet, exercise, and sleep habits.  Work and work environment.  Access to firearms.  Method of birth control.  Menstrual cycle.  Pregnancy history. What immunizations do I need? Vaccines are usually given at various ages, according to a schedule. Your health care provider will recommend vaccines for you based on your age, medical history, and lifestyle or other factors, such as travel or where you work.   What tests do I need? Blood tests  Lipid and cholesterol levels. These may be checked every 5 years starting at age 20.  Hepatitis C test.  Hepatitis B test. Screening  Diabetes screening. This is done by checking your blood sugar (glucose) after you have not eaten for a while (fasting).  STD (sexually transmitted disease) testing, if you are at risk.  BRCA-related cancer screening. This may be  done if you have a family history of breast, ovarian, tubal, or peritoneal cancers.  Pelvic exam and Pap test. This may be done every 3 years starting at age 21. Starting at age 30, this may be done every 5 years if you have a Pap test in combination with an HPV test. Talk with your health care provider about your test results, treatment options, and if necessary, the need for more tests.   Follow these instructions at home: Eating and drinking  Eat a healthy diet that includes fresh fruits and vegetables, whole grains, lean protein, and low-fat dairy products.  Take vitamin and mineral supplements as recommended by your health care provider.  Do not drink alcohol if: ? Your health care provider tells you not to drink. ? You are pregnant, may be pregnant, or are planning to become pregnant.  If you drink alcohol: ? Limit how much you have to 0-1 drink a day. ? Be aware of how much alcohol is in your drink. In the [place] one drink equals one 12 oz bottle of beer (355 mL), one 5 oz glass of wine (148 mL), or one 1 oz glass of hard liquor (44 mL).   Lifestyle  Take daily care of your teeth and gums. Brush your teeth every morning and night with fluoride toothpaste. Floss one time each day.  Stay active. Exercise for at least 30 minutes 5 or more days each week.  Do not use any products that contain nicotine or tobacco, such as cigarettes, e-cigarettes, and chewing tobacco. If you need help quitting, ask your health care provider.  Do not   use drugs.  If you are sexually active, practice safe sex. Use a condom or other form of protection to prevent STIs (sexually transmitted infections).  If you do not wish to become pregnant, use a form of birth control. If you plan to become pregnant, see your health care provider for a prepregnancy visit.  Find healthy ways to cope with stress, such as: ? Meditation, yoga, or listening to music. ? Journaling. ? Talking to a trusted  person. ? Spending time with friends and family. Safety  Always wear your seat belt while driving or riding in a vehicle.  Do not drive: ? If you have been drinking alcohol. Do not ride with someone who has been drinking. ? When you are tired or distracted. ? While texting.  Wear a helmet and other protective equipment during sports activities.  If you have firearms in your house, make sure you follow all gun safety procedures.  Seek help if you have been physically or sexually abused. What's next?  Go to your health care provider once a year for an annual wellness visit.  Ask your health care provider how often you should have your eyes and teeth checked.  Stay up to date on all vaccines. This information is not intended to replace advice given to you by your health care provider. Make sure you discuss any questions you have with your health care provider. Document Revised: 02/10/2020 Document Reviewed: 02/23/2018 Elsevier Patient Education  2021 Elsevier Inc.  

## 2021-10-01 ENCOUNTER — Encounter: Payer: Self-pay | Admitting: Family Medicine

## 2021-10-01 ENCOUNTER — Ambulatory Visit (INDEPENDENT_AMBULATORY_CARE_PROVIDER_SITE_OTHER): Payer: Medicaid Other | Admitting: Family Medicine

## 2021-10-01 VITALS — BP 108/68 | HR 72 | Ht 64.0 in | Wt 131.0 lb

## 2021-10-01 DIAGNOSIS — L02212 Cutaneous abscess of back [any part, except buttock]: Secondary | ICD-10-CM | POA: Diagnosis not present

## 2021-10-01 DIAGNOSIS — N6313 Unspecified lump in the right breast, lower outer quadrant: Secondary | ICD-10-CM | POA: Diagnosis not present

## 2021-10-01 MED ORDER — CEPHALEXIN 500 MG PO CAPS: 500.0000 mg | ORAL_CAPSULE | Freq: Two times a day (BID) | ORAL | 0 refills | Status: DC

## 2021-10-01 NOTE — Assessment & Plan Note (Signed)
Incidentally noted x1 day, has not changed, not associated with redness, swelling, radiation, no similar episodes in the past, no axillary involvement.  Is not currently established with OB/GYN. ? ?Examination reveals no abnormalities to inspection, upon palpation there is an ovoid roughly 1 x 0.5 cm oblique/transverse freely mobile and mildly tender subcutaneous mass at the right lower outer quadrant breast posteriorly/inferior axillary chain. ? ?Female chaperone present, initials: [place] ? ?Given the presentation, possible etiologies may include fibrous breast tissue, peripheral milk duct cyst, among other etiologies.  I have advised supportive care and referral to OB/GYN has been placed for both establishment of care and further evaluation management of this area of concern. ?

## 2021-10-01 NOTE — Assessment & Plan Note (Signed)
Patient brings up roughly 9-year history of low back lesion, associated with swelling, minimal pain, has been stable for that timeframe without any prior medical evaluation.  She has noted over the past week, worsening pain, redness, and drainage of purulent fluid, has tried to express the area.  Denies any fevers, chills. ? ?Examination today reveals roughly 2 x 3 central lower lumbar abscess, questionable superimposed on mass, area is erythematous, no induration, no streaking, there is yellowish discharge, no additional fluid expressed. ? ?Her findings are most consistent with abscess, given stated history and chronicity, this can be considered superimposed on an underlying area of swelling.  Given the active infection, plan for Keflex (she will contact pediatrician to ensure compatibility with nursing/pumping), and referral to dermatology for further evaluation/management in addition to annual skin check. ? ?Chronic issue, symptomatic, Rx management. ?

## 2021-10-01 NOTE — Patient Instructions (Addendum)
-   Touch base with the pediatrician regarding nursing/pumping while on Keflex (cephalexin 500 mg twice daily x7 days) ?- Use moist heat/warm compresses and gentle massage around the low back area, keep area clean with warm, soapy water, avoid scrubbing, and replace bandages/dressing frequently ?- Can trial moist heat/warm compresses at the outer breast with gentle massage ?- Referral coordinator will contact you in regards to scheduling follow-ups with OB/GYN and dermatology ?- Return for follow-up in 6-8 weeks for annual physical ?- Contact us for any questions between now and then ?

## 2021-10-01 NOTE — Progress Notes (Signed)
?  ? ?  Primary Care / Sports Medicine Office Visit ? ?Patient Information:  ?Patient ID: Tiffany Franco, female DOB: Nov 15, 1985 Age: 36 y.o. MRN: [id number]  ? ?Tiffany Franco is a pleasant 36 y.o. female presenting with the following: ? ?Chief Complaint  ?Patient presents with  ? Establish Care  ? Cyst  ?  9 years, never had problems until about 1 year ago.   ? Breast Problem  ?  Pt woke up this morning and noticed a lump in her right breast   ? ? ?Vitals:  ? 10/01/21 1355  ?BP: 108/68  ?Pulse: 72  ?SpO2: 99%  ? ?Vitals:  ? 10/01/21 1355  ?Weight: 131 lb (59.4 kg)  ? ?Body mass index is 23.21 kg/m?. ? ?No results found.  ? ?Independent interpretation of notes and tests performed by another provider:  ? ?None ? ?Procedures performed:  ? ?None ? ?Pertinent History, Exam, Impression, and Recommendations:  ? ?Mass of lower outer quadrant of right breast ?Incidentally noted x1 day, has not changed, not associated with redness, swelling, radiation, no similar episodes in the past, no axillary involvement.  Is not currently established with OB/GYN. ? ?Examination reveals no abnormalities to inspection, upon palpation there is an ovoid roughly 1 x 0.5 cm oblique/transverse freely mobile and mildly tender subcutaneous mass at the right lower outer quadrant breast posteriorly/inferior axillary chain. ? ?Female chaperone present, initials: [place] ? ?Given the presentation, possible etiologies may include fibrous breast tissue, peripheral milk duct cyst, among other etiologies.  I have advised supportive care and referral to OB/GYN has been placed for both establishment of care and further evaluation management of this area of concern. ? ?Abscess of lower back ?Patient brings up roughly 9-year history of low back lesion, associated with swelling, minimal pain, has been stable for that timeframe without any prior medical evaluation.  She has noted over the past week, worsening pain, redness, and drainage of purulent fluid, has  tried to express the area.  Denies any fevers, chills. ? ?Examination today reveals roughly 2 x 3 central lower lumbar abscess, questionable superimposed on mass, area is erythematous, no induration, no streaking, there is yellowish discharge, no additional fluid expressed. ? ?Her findings are most consistent with abscess, given stated history and chronicity, this can be considered superimposed on an underlying area of swelling.  Given the active infection, plan for Keflex (she will contact pediatrician to ensure compatibility with nursing/pumping), and referral to dermatology for further evaluation/management in addition to annual skin check. ? ?Chronic issue, symptomatic, Rx management.  ? ?Orders & Medications ?Meds ordered this encounter  ?Medications  ? cephALEXin (KEFLEX) 500 MG capsule  ?  Sig: Take 1 capsule (500 mg total) by mouth 2 (two) times daily.  ?  Dispense:  14 capsule  ?  Refill:  0  ? ?Orders Placed This Encounter  ?Procedures  ? Ambulatory referral to Dermatology  ? Ambulatory referral to Gynecology  ?  ? ?Return in about 6 weeks (around 11/12/2021) for Annual Physical.  ?  ? ?Montel Culver, MD ? ? Primary Care Sports Medicine ?[facility] ?[place]  ? ?

## 2021-10-07 ENCOUNTER — Other Ambulatory Visit: Payer: Self-pay | Admitting: *Deleted

## 2021-10-07 DIAGNOSIS — N6314 Unspecified lump in the right breast, lower inner quadrant: Secondary | ICD-10-CM

## 2021-10-07 DIAGNOSIS — N6313 Unspecified lump in the right breast, lower outer quadrant: Secondary | ICD-10-CM

## 2021-10-15 ENCOUNTER — Other Ambulatory Visit: Payer: Medicaid Other

## 2021-10-19 ENCOUNTER — Encounter: Payer: Medicaid Other | Admitting: Obstetrics and Gynecology

## 2021-10-22 ENCOUNTER — Ambulatory Visit
Admission: RE | Admit: 2021-10-22 | Discharge: 2021-10-22 | Disposition: A | Discharge status: Home / Self Care | Payer: Medicaid Other | Source: Ambulatory Visit | Attending: Obstetrics and Gynecology | Admitting: Obstetrics and Gynecology

## 2021-10-22 ENCOUNTER — Other Ambulatory Visit: Payer: Self-pay | Admitting: Obstetrics and Gynecology

## 2021-10-22 DIAGNOSIS — N6313 Unspecified lump in the right breast, lower outer quadrant: Secondary | ICD-10-CM

## 2021-10-22 DIAGNOSIS — N6314 Unspecified lump in the right breast, lower inner quadrant: Secondary | ICD-10-CM

## 2021-10-22 DIAGNOSIS — R922 Inconclusive mammogram: Secondary | ICD-10-CM | POA: Diagnosis not present

## 2021-10-22 DIAGNOSIS — R921 Mammographic calcification found on diagnostic imaging of breast: Secondary | ICD-10-CM

## 2021-11-03 ENCOUNTER — Ambulatory Visit: Payer: Self-pay | Admitting: *Deleted

## 2021-11-03 NOTE — Telephone Encounter (Signed)
?  Chief Complaint: back pain , urinating blood  ?Symptoms: back pain, urinating dark brown to red blood , chills ?Frequency: 2 weeks  ?Pertinent Negatives: Patient denies fever, unable to urinate ?Disposition: '[]'$ ED /'[]'$ Urgent Care (no appt availability in office) / '[x]'$ Appointment(In office/virtual)/ '[]'$  [place] [facility]/ '[]'$ Home Care/ '[]'$ Refused Recommended Disposition /'[]'$  Mobile Bus/ '[]'$  Follow-up with PCP ?Additional Notes:  ? ?Recommended ED/UC if fever noted or inability to urinate. ? ? Reason for Disposition ? Side (flank) or back pain present ? ?Answer Assessment - Initial Assessment Questions ?1. COLOR of URINE: "Describe the color of the urine."  (e.g., tea-colored, pink, red, blood clots, bloody) ?    Tea colored to dark brown to red  ?2. ONSET: "When did the bleeding start?"  ?    2 weeks  ?3. EPISODES: "How many times has there been blood in the urine?" or "How many times today?" ?    Multiple  ?4. PAIN with URINATION: "Is there any pain with passing your urine?" If Yes, ask: "How bad is the pain?"  (Scale 1-10; or mild, moderate, severe) ?   - MILD - complains slightly about urination hurting ?   - MODERATE - interferes with normal activities   ?   - SEVERE - excruciating, unwilling or unable to urinate because of the pain  ?    No  ?5. FEVER: "Do you have a fever?" If Yes, ask: "What is your temperature, how was it measured, and when did it start?" ?    Chills  ?6. ASSOCIATED SYMPTOMS: "Are you passing urine more frequently than usual?" ?    less ?7. OTHER SYMPTOMS: "Do you have any other symptoms?" (e.g., back/flank pain, abdominal pain, vomiting) ?    Back pain urinating blood  ?8. PREGNANCY: "Is there any chance you are pregnant?" "When was your last menstrual period?" ?    na ? ?Protocols used: Urine - Blood In-A-AH ? ?

## 2021-11-04 ENCOUNTER — Ambulatory Visit (INDEPENDENT_AMBULATORY_CARE_PROVIDER_SITE_OTHER): Payer: Medicaid Other | Admitting: Family Medicine

## 2021-11-04 ENCOUNTER — Encounter: Payer: Self-pay | Admitting: Family Medicine

## 2021-11-04 VITALS — BP 108/76 | HR 62 | Temp 98.1°F | Ht 63.0 in | Wt 132.8 lb

## 2021-11-04 DIAGNOSIS — D649 Anemia, unspecified: Secondary | ICD-10-CM

## 2021-11-04 DIAGNOSIS — R319 Hematuria, unspecified: Secondary | ICD-10-CM | POA: Diagnosis not present

## 2021-11-04 DIAGNOSIS — R310 Gross hematuria: Secondary | ICD-10-CM | POA: Diagnosis not present

## 2021-11-04 LAB — POCT URINALYSIS DIPSTICK
Bilirubin, UA: NEGATIVE
Glucose, UA: NEGATIVE
Ketones, UA: NEGATIVE
Leukocytes, UA: NEGATIVE
Nitrite, UA: NEGATIVE
Protein, UA: NEGATIVE
Spec Grav, UA: 1.015 (ref 1.010–1.025)
Urobilinogen, UA: 0.2 E.U./dL
pH, UA: 6 (ref 5.0–8.0)

## 2021-11-04 LAB — POCT URINE PREGNANCY: Preg Test, Ur: NEGATIVE

## 2021-11-04 NOTE — Telephone Encounter (Signed)
FYI-Has appointment today

## 2021-11-04 NOTE — Assessment & Plan Note (Signed)
Patient with 2-week history of abdominal pain, cramping, and painless passage of gross hematuria.  She denies any overt flank pain, bladder pain, dysuria, change in urine consistency, odor, no discharge, no change in sexual activity or topical agents.  She does have chills but denies fevers.  Of note, she has a longstanding history of recurrent UTI.  Additionally, her last menstrual cycle was approximately 10/08/2021, while it has been somewhat irregular, she has noted heavier menses and possible clots in urine. ? ?Examination today reveals stable vitals over interval visit, patient resting in no acute distress, cardiopulmonary findings benign, abdomen with hyperactive bowel sounds, otherwise soft, nondistended, nontender, no rebound, no suprapubic tenderness, no CVA tenderness bilaterally.  Urinalysis performed at point-of-care reveals 2+ blood, otherwise no clots visualized and results normal otherwise.  Point-of-care urine hCG was negative. ? ?At this stage given her essentially painless gross hematuria, differential can include irregular menstrual cycle, infectious etiology (urine has been sent for culture), renal stone (KUB x-ray ordered), and possible sequela of multiple UTIs in the past.  She does have upcoming visit with gynecology for establishment of care later this month which I have encouraged her to maintain.  Over the interim I have ordered serum studies given her history of anemia and reported blood loss over the past 2 weeks.'s return as scheduled for annual physical and was advised to contact us for any symptom changes over the interim. ?

## 2021-11-04 NOTE — Assessment & Plan Note (Signed)
Previously noted from the 2022 timeframe on serial lab exams, given her gross hematuria over 2 weeks, updated CBC with differential and anemia labs ordered. ?

## 2021-11-04 NOTE — Patient Instructions (Signed)
-   We will contact you with lab results once available ?- Continue stay adequately hydrated and maintain adequate nutrition ?- Contact us for any change in symptoms ?- Maintain follow-up with gynecology ?- Return as scheduled for annual physical ?

## 2021-11-04 NOTE — Progress Notes (Signed)
?  ? ?Primary Care / Sports Medicine Office Visit ? ?Patient Information:  ?Patient ID: Tiffany Franco, female DOB: 1985-07-26 Age: 36 y.o. MRN: [id number]  ? ?Tiffany Franco is a pleasant 36 y.o. female presenting with the following: ? ?Chief Complaint  ?Patient presents with  ? Hematuria  ?  Blood in urine for 2 weeks, lower back pain cramps chills since yesterday  ? ? ?Vitals:  ? 11/04/21 1011  ?BP: 108/76  ?Pulse: 62  ?Temp: 98.1 ?F (36.7 ?C)  ?SpO2: 99%  ? ?Vitals:  ? 11/04/21 1011  ?Weight: 132 lb 12.8 oz (60.2 kg)  ?Height: '5\' 3"'$  (1.6 m)  ? ?Body mass index is 23.52 kg/m?. ? ?  ? ?Independent interpretation of notes and tests performed by another provider:  ? ?None ? ?Procedures performed:  ? ?None ? ?Pertinent History, Exam, Impression, and Recommendations:  ? ?Problem List Items Addressed This Visit   ? ?  ? Genitourinary  ? Gross hematuria - Primary  ?  Patient with 2-week history of abdominal pain, cramping, and painless passage of gross hematuria.  She denies any overt flank pain, bladder pain, dysuria, change in urine consistency, odor, no discharge, no change in sexual activity or topical agents.  She does have chills but denies fevers.  Of note, she has a longstanding history of recurrent UTI.  Additionally, her last menstrual cycle was approximately 10/08/2021, while it has been somewhat irregular, she has noted heavier menses and possible clots in urine. ? ?Examination today reveals stable vitals over interval visit, patient resting in no acute distress, cardiopulmonary findings benign, abdomen with hyperactive bowel sounds, otherwise soft, nondistended, nontender, no rebound, no suprapubic tenderness, no CVA tenderness bilaterally.  Urinalysis performed at point-of-care reveals 2+ blood, otherwise no clots visualized and results normal otherwise.  Point-of-care urine hCG was negative. ? ?At this stage given her essentially painless gross hematuria, differential can include irregular menstrual  cycle, infectious etiology (urine has been sent for culture), renal stone (KUB x-ray ordered), and possible sequela of multiple UTIs in the past.  She does have upcoming visit with gynecology for establishment of care later this month which I have encouraged her to maintain.  Over the interim I have ordered serum studies given her history of anemia and reported blood loss over the past 2 weeks.'s return as scheduled for annual physical and was advised to contact us for any symptom changes over the interim. ? ?  ?  ? Relevant Orders  ? CBC with Differential/Platelet  ? Iron, TIBC and Ferritin Panel  ? B12 and Folate Panel  ? POCT urine pregnancy (Completed)  ? DG Abd 1 View  ?  ? Other  ? Anemia  ?  Previously noted from the 2022 timeframe on serial lab exams, given her gross hematuria over 2 weeks, updated CBC with differential and anemia labs ordered. ? ?  ?  ? Relevant Orders  ? CBC with Differential/Platelet  ? Iron, TIBC and Ferritin Panel  ? B12 and Folate Panel  ? ?Other Visit Diagnoses   ? ? Hematuria, unspecified type      ? Relevant Orders  ? POCT urinalysis dipstick (Completed)  ? Urine Culture  ? CBC with Differential/Platelet  ? POCT urine pregnancy (Completed)  ? DG Abd 1 View  ? ?  ?  ? ?Orders & Medications ?No orders of the defined types were placed in this encounter. ? ?Orders Placed This Encounter  ?Procedures  ? Urine Culture  ? DG Abd 1  View  ? CBC with Differential/Platelet  ? Iron, TIBC and Ferritin Panel  ? B12 and Folate Panel  ? POCT urinalysis dipstick  ? POCT urine pregnancy  ?  ? ?Return for As scheduled.  ?  ? ?Montel Culver, MD ? ? Primary Care Sports Medicine ?[facility] ?[place]  ? ?

## 2021-11-05 LAB — CBC WITH DIFFERENTIAL/PLATELET
Basophils Absolute: 0 10*3/uL (ref 0.0–0.2)
Basos: 1 %
EOS (ABSOLUTE): 0.4 10*3/uL (ref 0.0–0.4)
Eos: 6 %
Hematocrit: 37.6 % (ref 34.0–46.6)
Hemoglobin: 12.8 g/dL (ref 11.1–15.9)
Immature Grans (Abs): 0 10*3/uL (ref 0.0–0.1)
Immature Granulocytes: 0 %
Lymphocytes Absolute: 1.8 10*3/uL (ref 0.7–3.1)
Lymphs: 31 %
MCH: 30.2 pg (ref 26.6–33.0)
MCHC: 34 g/dL (ref 31.5–35.7)
MCV: 89 fL (ref 79–97)
Monocytes Absolute: 0.5 10*3/uL (ref 0.1–0.9)
Monocytes: 9 %
Neutrophils Absolute: 3.1 10*3/uL (ref 1.4–7.0)
Neutrophils: 53 %
Platelets: 254 10*3/uL (ref 150–450)
RBC: 4.24 x10E6/uL (ref 3.77–5.28)
RDW: 12.1 % (ref 11.7–15.4)
WBC: 5.9 10*3/uL (ref 3.4–10.8)

## 2021-11-05 LAB — B12 AND FOLATE PANEL
Folate: 19.3 ng/mL (ref >3.0)
Vitamin B-12: 516 pg/mL (ref 232–1245)

## 2021-11-05 LAB — IRON,TIBC AND FERRITIN PANEL
Ferritin: 41 ng/mL (ref 15–150)
Iron Saturation: 25 % (ref 15–55)
Iron: 88 ug/dL (ref 27–159)
Total Iron Binding Capacity: 355 ug/dL (ref 250–450)
UIBC: 267 ug/dL (ref 131–425)

## 2021-11-06 LAB — URINE CULTURE

## 2021-11-06 LAB — SPECIMEN STATUS REPORT

## 2021-11-13 ENCOUNTER — Encounter: Payer: Self-pay | Admitting: Licensed Practical Nurse

## 2021-11-16 ENCOUNTER — Encounter: Payer: Medicaid Other | Admitting: Family Medicine

## 2021-11-19 ENCOUNTER — Encounter: Payer: Medicaid Other | Admitting: Family Medicine

## 2021-11-27 ENCOUNTER — Encounter: Payer: Medicaid Other | Admitting: Family Medicine

## 2021-11-30 ENCOUNTER — Encounter: Payer: Medicaid Other | Admitting: Family Medicine

## 2022-03-01 ENCOUNTER — Encounter: Payer: Self-pay | Admitting: Emergency Medicine

## 2022-03-01 ENCOUNTER — Ambulatory Visit
Admission: EM | Admit: 2022-03-01 | Discharge: 2022-03-01 | Disposition: A | Discharge status: Home / Self Care | Payer: Medicaid Other | Attending: Emergency Medicine | Admitting: Emergency Medicine

## 2022-03-01 DIAGNOSIS — J069 Acute upper respiratory infection, unspecified: Secondary | ICD-10-CM | POA: Diagnosis not present

## 2022-03-01 MED ORDER — BENZONATATE 100 MG PO CAPS: 200.0000 mg | ORAL_CAPSULE | Freq: Three times a day (TID) | ORAL | 0 refills | Status: DC

## 2022-03-01 MED ORDER — IPRATROPIUM BROMIDE 0.06 % NA SOLN: 2.0000 | Freq: Four times a day (QID) | NASAL | 12 refills | Status: DC

## 2022-03-01 MED ORDER — PROMETHAZINE-DM 6.25-15 MG/5ML PO SYRP: 5.0000 mL | ORAL_SOLUTION | Freq: Four times a day (QID) | ORAL | 0 refills | Status: DC | PRN

## 2022-03-01 NOTE — Discharge Instructions (Signed)

## 2022-03-01 NOTE — ED Provider Notes (Signed)
[facility]    CSN: [id number] Arrival date & time: 03/01/22  1416      History   Chief Complaint Chief Complaint  Patient presents with   Cough    HPI Tiffany Franco is a 36 y.o. female.   HPI  36 year old female here for evaluation respiratory complaints.  Patient reports that 8 days ago she woke up with a sore throat that later turned into a cough.  In the last 3 days she stopped runny nose, nasal congestion, sneezing, and ear pain.  She denies any fever, shortness of breath, wheezing, GI complaints, body ache, or headaches.  Past Medical History:  Diagnosis Date   Anxiety    Recurrent UTI     Patient Active Problem List   Diagnosis Date Noted   Gross hematuria 11/04/2021   Anemia 11/04/2021   Abscess of lower back 10/01/2021   Mass of lower outer quadrant of right breast 10/01/2021   Encounter for elective induction of labor 08/23/2020   Labor and delivery, indication for care 08/15/2020   Indication for care in labor and delivery, antepartum 08/11/2020   Desires VBAC (vaginal birth after cesarean) trial 07/16/2020   Encounter for supervision of high-risk pregnancy with multigravida of advanced maternal age 17/06/2019   Previous cesarean section 02/27/2020   Family history of fraternal twins 02/27/2020   History of fourth degree perineal laceration 02/27/2020   Dermatofibroma 08/24/2017   Epidermal inclusion cyst 08/24/2017   Recurrent UTI 08/24/2017   Depression 03/10/2014    Past Surgical History:  Procedure Laterality Date   CESAREAN SECTION      OB History     Gravida  3   Para  3   Term  3   Preterm      AB      Living  4      SAB      IAB      Ectopic      Multiple  1   Live Births  4            Home Medications    Prior to Admission medications   Medication Sig Start Date End Date Taking? Authorizing Provider  benzonatate (TESSALON) 100 MG capsule Take 2 capsules (200 mg total) by mouth every 8 (eight)  hours. 03/01/22  Yes Margarette Canada, NP  ipratropium (ATROVENT) 0.06 % nasal spray Place 2 sprays into both nostrils 4 (four) times daily. 03/01/22  Yes Margarette Canada, NP  promethazine-dextromethorphan (PROMETHAZINE-DM) 6.25-15 MG/5ML syrup Take 5 mLs by mouth 4 (four) times daily as needed. 03/01/22  Yes Margarette Canada, NP    Family History Family History  Problem Relation Age of Onset   Healthy Mother    Healthy Father    Healthy Sister    Healthy Brother    Breast cancer Maternal Grandmother    Diabetes Maternal Grandmother    Healthy Maternal Grandfather    Breast cancer Paternal Grandmother    Healthy Paternal Grandfather    Healthy Brother    Healthy Brother    Healthy Brother    Healthy Sister    Healthy Sister    Healthy Sister     Social History Social History   Tobacco Use   Smoking status: Never   Smokeless tobacco: Never  Vaping Use   Vaping Use: Never used  Substance Use Topics   Alcohol use: Never   Drug use: Never     Allergies   Patient has no known allergies.  Review of Systems Review of Systems  Constitutional:  Negative for fever.  HENT:  Positive for congestion, ear pain, rhinorrhea, sneezing and sore throat.   Respiratory:  Positive for cough. Negative for shortness of breath and wheezing.   Gastrointestinal:  Negative for diarrhea, nausea and vomiting.  Musculoskeletal:  Negative for arthralgias and myalgias.  Neurological:  Negative for headaches.  Hematological: Negative.   Psychiatric/Behavioral: Negative.       Physical Exam Triage Vital Signs ED Triage Vitals  Enc Vitals Group     BP 03/01/22 1515 127/85     Pulse Rate 03/01/22 1515 66     Resp 03/01/22 1515 18     Temp 03/01/22 1515 98.9 F (37.2 C)     Temp Source 03/01/22 1515 Oral     SpO2 03/01/22 1515 100 %     Weight 03/01/22 1514 133 lb (60.3 kg)     Height 03/01/22 1514 '5\' 3"'$  (1.6 m)     Head Circumference --      Peak Flow --      Pain Score 03/01/22 1516 0     Pain  Loc --      Pain Edu? --      Excl. in [place]? --    No data found.  Updated Vital Signs BP 127/85 (BP Location: Left Arm)   Pulse 66   Temp 98.9 F (37.2 C) (Oral)   Resp 18   Ht '5\' 3"'$  (1.6 m)   Wt 133 lb (60.3 kg)   LMP 02/28/2022   SpO2 100%   BMI 23.56 kg/m   Visual Acuity Right Eye Distance:   Left Eye Distance:   Bilateral Distance:    Right Eye Near:   Left Eye Near:    Bilateral Near:     Physical Exam Vitals and nursing note reviewed.  Constitutional:      Appearance: Normal appearance. She is not ill-appearing.  HENT:     Head: Normocephalic and atraumatic.     Right Ear: Tympanic membrane, ear canal and external ear normal. There is no impacted cerumen.     Left Ear: Tympanic membrane, ear canal and external ear normal. There is no impacted cerumen.     Nose: Congestion and rhinorrhea present.     Mouth/Throat:     Mouth: Mucous membranes are moist.     Pharynx: Oropharynx is clear. Posterior oropharyngeal erythema present. No oropharyngeal exudate.  Cardiovascular:     Rate and Rhythm: Normal rate and regular rhythm.     Pulses: Normal pulses.     Heart sounds: Normal heart sounds. No murmur heard.    No friction rub. No gallop.  Pulmonary:     Effort: Pulmonary effort is normal.     Breath sounds: Normal breath sounds. No wheezing, rhonchi or rales.  Musculoskeletal:     Cervical back: Normal range of motion and neck supple.  Lymphadenopathy:     Cervical: No cervical adenopathy.  Skin:    General: Skin is warm and dry.     Capillary Refill: Capillary refill takes less than 2 seconds.     Findings: No erythema or rash.  Neurological:     General: No focal deficit present.     Mental Status: She is alert and oriented to person, place, and time.  Psychiatric:        Mood and Affect: Mood normal.        Behavior: Behavior normal.        Thought Content:  Thought content normal.        Judgment: Judgment normal.      UC Treatments / Results   Labs (all labs ordered are listed, but only abnormal results are displayed) Labs Reviewed - No data to display  EKG   Radiology No results found.  Procedures Procedures (including critical care time)  Medications Ordered in UC Medications - No data to display  Initial Impression / Assessment and Plan / UC Course  I have reviewed the triage vital signs and the nursing notes.  Pertinent labs & imaging results that were available during my care of the patient were reviewed by me and considered in my medical decision making (see chart for details).   Patient is a nontoxic-appearing 36 year old female here for evaluation of respiratory symptoms as outlined in HPI above.  Patient's physical exam reveals pearly-gray tympanic membranes bilaterally with normal light reflex and clear external auditory canals.  Nasal mucosa is erythematous and edematous with scant clear discharge in both nares.  Oropharyngeal exam reveals posterior oropharyngeal erythema and injection with clear postnasal drip.  No cervical lymphadenopathy appreciable exam.  Cardiopulmonary exam reveals clear lung sounds in all fields.  Patient exam is consistent with a viral upper respiratory infection with cough.  I will treat her symptoms with Atrovent nasal spray, Tessalon Perles, and Promethazine DM cough syrup.  I do not feel she needs antibiotic at this time as she has no purulent discharge from the nasal passages and she has been afebrile.   Final Clinical Impressions(s) / UC Diagnoses   Final diagnoses:  Viral URI with cough     Discharge Instructions      Use the Atrovent nasal spray, 2 squirts in each nostril every 6 hours, as needed for runny nose and postnasal drip.  Use the Tessalon Perles every 8 hours during the day.  Take them with a small sip of water.  They may give you some numbness to the base of your tongue or a metallic taste in your mouth, this is normal.  Use the Promethazine DM cough syrup at  bedtime for cough and congestion.  It will make you drowsy so do not take it during the day.  Return for reevaluation or see your primary care provider for any new or worsening symptoms.      ED Prescriptions     Medication Sig Dispense Auth. Provider   benzonatate (TESSALON) 100 MG capsule Take 2 capsules (200 mg total) by mouth every 8 (eight) hours. 21 capsule Margarette Canada, NP   ipratropium (ATROVENT) 0.06 % nasal spray Place 2 sprays into both nostrils 4 (four) times daily. 15 mL Margarette Canada, NP   promethazine-dextromethorphan (PROMETHAZINE-DM) 6.25-15 MG/5ML syrup Take 5 mLs by mouth 4 (four) times daily as needed. 118 mL Margarette Canada, NP      PDMP not reviewed this encounter.   Margarette Canada, NP 03/01/22 1540

## 2022-03-01 NOTE — ED Triage Notes (Signed)
Pt here with C/O cough and sore throat for 2 weeks. No fevers or other SX.

## 2022-03-01 NOTE — ED Triage Notes (Signed)
Pt states throat hurts at night, hard to sleep.

## 2022-03-24 ENCOUNTER — Ambulatory Visit: Admission: EM | Admit: 2022-03-24 | Payer: Medicaid Other

## 2022-03-31 ENCOUNTER — Ambulatory Visit: Payer: Medicaid Other | Admitting: Dermatology

## 2022-04-02 ENCOUNTER — Encounter: Payer: Self-pay | Admitting: Certified Nurse Midwife

## 2022-04-29 ENCOUNTER — Ambulatory Visit
Admission: RE | Admit: 2022-04-29 | Discharge: 2022-04-29 | Disposition: A | Discharge status: Home / Self Care | Payer: Medicaid Other | Attending: Family Medicine | Admitting: Family Medicine

## 2022-04-29 ENCOUNTER — Encounter: Payer: Self-pay | Admitting: Family Medicine

## 2022-04-29 ENCOUNTER — Ambulatory Visit
Admission: RE | Admit: 2022-04-29 | Discharge: 2022-04-29 | Disposition: A | Discharge status: Home / Self Care | Payer: Medicaid Other | Source: Ambulatory Visit | Attending: Family Medicine | Admitting: Family Medicine

## 2022-04-29 ENCOUNTER — Ambulatory Visit (INDEPENDENT_AMBULATORY_CARE_PROVIDER_SITE_OTHER): Payer: Medicaid Other | Admitting: Family Medicine

## 2022-04-29 VITALS — BP 122/80 | HR 63 | Ht 63.0 in | Wt 134.0 lb

## 2022-04-29 DIAGNOSIS — M533 Sacrococcygeal disorders, not elsewhere classified: Secondary | ICD-10-CM

## 2022-04-29 DIAGNOSIS — K121 Other forms of stomatitis: Secondary | ICD-10-CM | POA: Diagnosis not present

## 2022-04-29 MED ORDER — PANTOPRAZOLE SODIUM 20 MG PO TBEC: 20.0000 mg | DELAYED_RELEASE_TABLET | Freq: Every day | ORAL | 0 refills | Status: DC

## 2022-04-29 NOTE — Patient Instructions (Signed)
-   Obtain x-rays - Start pantoprazole daily on empty stomach x 1 week - Can continue weeks 2 and 3 if symptoms persist - Reach out if mouth symptoms persist into week 3 without improvement - Start home exercises after hearing back regarding x-rays - Return for physical 10/2022

## 2022-04-29 NOTE — Progress Notes (Signed)
     Primary Care / Sports Medicine Office Visit  Patient Information:  Patient ID: Tiffany Franco, female DOB: 1985/11/03 Age: 36 y.o. MRN: [id number]   Tiffany Franco is a pleasant 36 y.o. female presenting with the following:  Chief Complaint  Patient presents with   mouth issue    Feels burnt, was sick with stomach bug last week, is burning since then    Vitals:   04/29/22 1541  BP: 122/80  Pulse: 63  SpO2: 99%   Vitals:   04/29/22 1541  Weight: 134 lb (60.8 kg)  Height: '5\' 3"'$  (1.6 m)   Body mass index is 23.74 kg/m.  No results found.   Independent interpretation of notes and tests performed by another provider:   None  Procedures performed:   None  Pertinent History, Exam, Impression, and Recommendations:   Problem List Items Addressed This Visit       Digestive   Stomatitis - Primary    Altered taste and "mouth burning "shortly after severe episode of described gastroenteritis with 8-hour bout of recurrent emesis and diarrhea.  Feels that mouth is dry all the times, no significant pain but has noted altered taste.  Denies any changes in smell, no epigastric abdominal pain, bowels have returned to baseline.  Examination reveals no oral lesions, plaques or patches, is somewhat tacky, no gingival lesions, oropharynx benign, nasopharynx, tympanic membranes, canals benign.  She has benign cardiopulmonary findings, abdomen is soft, nontender, nondistended without hepatosplenomegaly.  Findings are most consistent with stomatitis, considered sequela of recent bout of GE, supportive care advised and 1 week of pantoprazole prescribed with low threshold to advance to weeks 2 and 3.  She was advised to contact her office if symptoms persist in 2 weeks 3.  Review of urgent care visit from 03/01/2022 performed.      Relevant Medications   pantoprazole (PROTONIX) 20 MG tablet     Other   Nontraumatic coccydynia    Patient with atraumatic onset of coccygeal pain since  10/2021 timeframe that coincided with increased activity in the gym (starting a [employer]).  Denies any direct impact to the coccyx but did perform floor exercises aggressively.  She does have focal tenderness at the coccyx without crepitus, otherwise nontender.  She denies any prior episodes of injury or symptomatology.  Given the duration of symptoms, plan for dedicated x-rays of the lumbar spine and coccyx, home exercises after review of the x-rays, and we will coordinate follow-up accordingly.      Relevant Orders   DG Sacrum/Coccyx   DG Lumbar Spine Complete     Orders & Medications Meds ordered this encounter  Medications   pantoprazole (PROTONIX) 20 MG tablet    Sig: Take 1 tablet (20 mg total) by mouth daily.    Dispense:  21 tablet    Refill:  0   Orders Placed This Encounter  Procedures   DG Sacrum/Coccyx   DG Lumbar Spine Complete     Return for CPE.     Montel Culver, MD   Primary Care Sports Medicine [place]

## 2022-04-29 NOTE — Assessment & Plan Note (Addendum)
Altered taste and "mouth burning "shortly after severe episode of described gastroenteritis with 8-hour bout of recurrent emesis and diarrhea.  Feels that mouth is dry all the times, no significant pain but has noted altered taste.  Denies any changes in smell, no epigastric abdominal pain, bowels have returned to baseline.  Examination reveals no oral lesions, plaques or patches, is somewhat tacky, no gingival lesions, oropharynx benign, nasopharynx, tympanic membranes, canals benign.  She has benign cardiopulmonary findings, abdomen is soft, nontender, nondistended without hepatosplenomegaly.  Findings are most consistent with stomatitis, considered sequela of recent bout of GE, supportive care advised and 1 week of pantoprazole prescribed with low threshold to advance to weeks 2 and 3.  She was advised to contact her office if symptoms persist in 2 weeks 3.  Review of urgent care visit from 03/01/2022 performed.

## 2022-04-29 NOTE — Assessment & Plan Note (Signed)
Patient with atraumatic onset of coccygeal pain since 10/2021 timeframe that coincided with increased activity in the gym (starting a [employer]).  Denies any direct impact to the coccyx but did perform floor exercises aggressively.  She does have focal tenderness at the coccyx without crepitus, otherwise nontender.  She denies any prior episodes of injury or symptomatology.  Given the duration of symptoms, plan for dedicated x-rays of the lumbar spine and coccyx, home exercises after review of the x-rays, and we will coordinate follow-up accordingly.

## 2022-07-28 ENCOUNTER — Ambulatory Visit: Payer: Self-pay

## 2022-07-28 NOTE — Telephone Encounter (Signed)
Reason for Disposition  [1] MODERATE weakness (i.e., interferes with work, school, normal activities) AND [2] new-onset or worsening  Answer Assessment - Initial Assessment Questions 1. MECHANISM: "How did the fall happen?"     Tripped 2. DOMESTIC VIOLENCE AND ELDER ABUSE SCREENING: "Did you fall because someone pushed you or tried to hurt you?" If Yes, ask: "Are you safe now?"     No 3. ONSET: "When did the fall happen?" (e.g., minutes, hours, or days ago)     Last night 4. LOCATION: "What part of the body hit the ground?" (e.g., back, buttocks, head, hips, knees, hands, head, stomach)     [place] 5. INJURY: "Did you hurt (injure) yourself when you fell?" If Yes, ask: "What did you injure? Tell me more about this?" (e.g., body area; type of injury; pain severity)"     Ankle 6. PAIN: "Is there any pain?" If Yes, ask: "How bad is the pain?" (e.g., Scale 1-10; or mild,  moderate, severe)   - NONE (0): No pain   - MILD (1-3): Doesn't interfere with normal activities    - MODERATE (4-7): Interferes with normal activities or awakens from sleep    - SEVERE (8-10): Excruciating pain, unable to do any normal activities      8-9 7. SIZE: For cuts, bruises, or swelling, ask: "How large is it?" (e.g., inches or centimeters)      No 8. PREGNANCY: "Is there any chance you are pregnant?" "When was your last menstrual period?"     No 9. OTHER SYMPTOMS: "Do you have any other symptoms?" (e.g., dizziness, fever, weakness; new onset or worsening).      No 10. CAUSE: "What do you think caused the fall (or falling)?" (e.g., tripped, dizzy spell)       Tripped  Protocols used: Falls and [facility]

## 2022-07-28 NOTE — Telephone Encounter (Signed)
  Chief Complaint: [place] on   bleachers last night, hurt right ankle Symptoms: Pain Frequency: Last night Pertinent Negatives: Patient denies  Disposition: '[]'$ ED /'[x]'$ Urgent Care (no appt availability in office) / '[]'$ Appointment(In office/virtual)/ '[]'$  [place] [facility]/ '[]'$ Home Care/ '[]'$ Refused Recommended Disposition /'[]'$ [place] [facility]/ '[]'$  Follow-up with PCP Additional Notes: Going to [facility].

## 2022-09-27 ENCOUNTER — Encounter: Payer: Self-pay | Admitting: Family Medicine

## 2022-09-27 NOTE — Telephone Encounter (Signed)
Please advise 

## 2022-12-24 ENCOUNTER — Ambulatory Visit (INDEPENDENT_AMBULATORY_CARE_PROVIDER_SITE_OTHER): Payer: Medicaid Other

## 2022-12-24 VITALS — BP 112/72 | HR 67 | Resp 16 | Ht 64.0 in | Wt 140.1 lb

## 2022-12-24 DIAGNOSIS — N912 Amenorrhea, unspecified: Secondary | ICD-10-CM

## 2022-12-24 DIAGNOSIS — Z3201 Encounter for pregnancy test, result positive: Secondary | ICD-10-CM | POA: Diagnosis not present

## 2022-12-24 LAB — POCT URINE PREGNANCY: Preg Test, Ur: POSITIVE — AB

## 2022-12-24 NOTE — Progress Notes (Signed)
    NURSE VISIT NOTE  Subjective:    Patient ID: Tiffany Franco, female    DOB: 21-Dec-1985, 37 y.o.   MRN: [id number]  HPI  Patient is a 37 y.o. [id number] female who presents for evaluation of amenorrhea. She believes she could be pregnant. Pregnancy is desired. Sexual Activity: single partner, contraception: none. Current symptoms also include: breast tenderness, fatigue, morning sickness, nausea, and positive home pregnancy test. Last period was normal.    Objective:    BP 112/72   Pulse 67   Resp 16   Ht 5\' 4"  (1.626 m)   Wt 140 lb 1.6 oz (63.5 kg)   LMP 10/27/2022   BMI 24.05 kg/m   Lab Review  Results for orders placed or performed in visit on 12/24/22  POCT urine pregnancy  Result Value Ref Range   Preg Test, Ur Positive (A) Negative    Assessment:   1. Amenorrhea     Plan:   Pregnancy Test: Positive  Estimated Date of Delivery: 08/03/2023. Encouraged well-balanced diet, plenty of rest when needed, pre-natal vitamins daily and walking for exercise.  Discussed self-help for nausea, avoiding OTC medications until consulting provider or pharmacist, other than Tylenol as needed, minimal caffeine (1-2 cups daily) and avoiding alcohol.   She will schedule her nurse visit @ [redacted] wks pregnant, u/s for dating and labs @10  wk, and NOB visit at [redacted] wk pregnant.    Feel free to call with any questions.     Santiago Bumpers, CMA [facility]

## 2022-12-24 NOTE — Patient Instructions (Addendum)
Morning Sickness  Morning sickness is when you feel like you may vomit (feel nauseous) during pregnancy. Sometimes, you may vomit. Morning sickness most often happens in the morning, but it can also happen at any time of the day. Some women may have morning sickness that makes them vomit all the time. This is a more serious problem that needs treatment. What are the causes? The cause of this condition is not known. What increases the risk? You had vomiting or a feeling like you may vomit before your pregnancy. You had morning sickness in another pregnancy. You are pregnant with more than one baby, such as twins. What are the signs or symptoms? Feeling like you may vomit. Vomiting. How is this treated? Treatment is usually not needed for this condition. You may only need to change what you eat. In some cases, your doctor may give you some things to take for your condition. These include: Vitamin B6 supplements 25 mg or 50 mg. You Medicines to treat the feeling that you may vomit. Ginger. Follow these instructions at home: Medicines Take over-the-counter and prescription medicines only as told by your doctor. Do not take any medicines until you talk with your doctor about them first. Take multivitamins before you get pregnant. These can stop or lessen the symptoms of morning sickness. Eating and drinking Eat dry toast or crackers before getting out of bed. Eat 5 or 6 small meals a day. Eat dry and bland foods like rice and baked potatoes. Do not eat greasy, fatty, or spicy foods. Have someone cook for you if the smell of food causes you to vomit or to feel like you may vomit. If you feel like you may vomit after taking prenatal vitamins, take them at night or with a snack. Eat protein foods when you need a snack. Nuts, yogurt, and cheese are good choices. Drink fluids throughout the day. Try ginger ale made with real ginger, ginger tea made from fresh grated ginger, or ginger  candies. General instructions Do not smoke or use any products that contain nicotine or tobacco. If you need help quitting, ask your doctor. Use an air purifier to keep the air in your house free of smells. Get lots of fresh air. Try to avoid smells that make you feel sick. Try wearing an acupressure wristband. This is a wristband that is used to treat seasickness. Try a treatment called acupuncture. In this treatment, a doctor puts needles into certain areas of your body to make you feel better. Contact a doctor if: You need medicine to feel better. You feel dizzy or light-headed. You are losing weight. Get help right away if: The feeling that you may vomit will not go away, or you cannot stop vomiting. You faint. You have very bad pain in your belly. Summary Morning sickness is when you feel like you may vomit (feel nauseous) during pregnancy. You may feel sick in the morning, but you can feel this way at any time of the day. Making some changes to what you eat may help your symptoms go away. This information is not intended to replace advice given to you by your health care provider. Make sure you discuss any questions you have with your health care provider. Document Revised: 01/28/2020 Document Reviewed: 01/07/2020 Elsevier Patient Education  2024 [employer]. First Trimester of Pregnancy  The first trimester of pregnancy starts on the first day of your last menstrual period until the end of week 12. This is also called months 1  through 3 of pregnancy. Body changes during your first trimester Your body goes through many changes during pregnancy. The changes usually return to normal after your baby is born. Physical changes You may gain or lose weight. Your breasts may grow larger and hurt. The area around your nipples may get darker. Dark spots or blotches may develop on your face. You may have changes in your hair. Health changes You may feel like you might vomit (nauseous),  and you may vomit. You may have heartburn. You may have headaches. You may have trouble pooping (constipation). Your gums may bleed. Other changes You may get tired easily. You may pee (urinate) more often. Your menstrual periods will stop. You may not feel hungry. You may want to eat certain kinds of food. You may have changes in your emotions from day to day. You may have more dreams. Follow these instructions at home: Medicines Take over-the-counter and prescription medicines only as told by your doctor. Some medicines are not safe during pregnancy. Take a prenatal vitamin that contains at least 600 micrograms (mcg) of folic acid. Eating and drinking Eat healthy meals that include: Fresh fruits and vegetables. Whole grains. Good sources of protein, such as meat, eggs, or tofu. Low-fat dairy products. Avoid raw meat and unpasteurized juice, milk, and cheese. If you feel like you may vomit, or you vomit: Eat 4 or 5 small meals a day instead of 3 large meals. Try eating a few soda crackers. Drink liquids between meals instead of during meals. You may need to take these actions to prevent or treat trouble pooping: Drink enough fluids to keep your pee (urine) pale yellow. Eat foods that are high in fiber. These include beans, whole grains, and fresh fruits and vegetables. Limit foods that are high in fat and sugar. These include fried or sweet foods. Activity Exercise only as told by your doctor. Most people can do their usual exercise routine during pregnancy. Stop exercising if you have cramps or pain in your lower belly (abdomen) or low back. Do not exercise if it is too hot or too humid, or if you are in a place of great height (high altitude). Avoid heavy lifting. If you choose to, you may have sex unless your doctor tells you not to. Relieving pain and discomfort Wear a good support bra if your breasts are sore. Rest with your legs raised (elevated) if you have leg cramps  or low back pain. If you have bulging veins (varicose veins) in your legs: Wear support hose as told by your doctor. Raise your feet for 15 minutes, 3-4 times a day. Limit salt in your food. Safety Wear your seat belt at all times when you are in a car. Talk with your doctor if someone is hurting you or yelling at you. Talk with your doctor if you are feeling sad or have thoughts of hurting yourself. Lifestyle Do not use hot tubs, steam rooms, or saunas. Do not douche. Do not use tampons or scented sanitary pads. Do not use herbal medicines, illegal drugs, or medicines that are not approved by your doctor. Do not drink alcohol. Do not smoke or use any products that contain nicotine or tobacco. If you need help quitting, ask your doctor. Avoid cat litter boxes and soil that is used by cats. These carry germs that can cause harm to the baby and can cause a loss of your baby by miscarriage or stillbirth. General instructions Keep all follow-up visits. This is important. Ask for  help if you need counseling or if you need help with nutrition. Your doctor can give you advice or tell you where to go for help. Visit your dentist. At home, brush your teeth with a soft toothbrush. Floss gently. Write down your questions. Take them to your prenatal visits. Where to find more information American Pregnancy Association: americanpregnancy.org [employer]: www.acog.org Office on Women's Health: [employer].nz Contact a doctor if: You are dizzy. You have a fever. You have mild cramps or pressure in your lower belly. You have a nagging pain in your belly area. You continue to feel like you may vomit, you vomit, or you have watery poop (diarrhea) for 24 hours or longer. You have a bad-smelling fluid coming from your vagina. You have pain when you pee. You are exposed to a disease that spreads from person to person, such as chickenpox, measles, Zika  virus, HIV, or hepatitis. Get help right away if: You have spotting or bleeding from your vagina. You have very bad belly cramping or pain. You have shortness of breath or chest pain. You have any kind of injury, such as from a fall or a car crash. You have new or increased pain, swelling, or redness in an arm or leg. Summary The first trimester of pregnancy starts on the first day of your last menstrual period until the end of week 12 (months 1 through 3). Eat 4 or 5 small meals a day instead of 3 large meals. Do not smoke or use any products that contain nicotine or tobacco. If you need help quitting, ask your doctor. Keep all follow-up visits. This information is not intended to replace advice given to you by your health care provider. Make sure you discuss any questions you have with your health care provider. Document Revised: 11/21/2019 Document Reviewed: 09/27/2019 Elsevier Patient Education  2024 [employer] Inc[employer] Commonly Asked Questions During Pregnancy  Cats: A parasite can be excreted in cat feces.  To avoid exposure you need to have another person empty the little box.  If you must empty the litter box you will need to wear gloves.  Wash your hands after handling your cat.  This parasite can also be found in raw or undercooked meat so this should also be avoided.  Colds, Sore Throats, Flu: Please check your medication sheet to see what you can take for symptoms.  If your symptoms are unrelieved by these medications please call the office.  Dental Work: Most any dental work Agricultural consultant recommends is permitted.  X-rays should only be taken during the first trimester if absolutely necessary.  Your abdomen should be shielded with a lead apron during all x-rays.  Please notify your provider prior to receiving any x-rays.  Novocaine is fine; gas is not recommended.  If your dentist requires a note from [place] prior to dental work please call the office and we will provide one for  you.  Exercise: Exercise is an important part of staying healthy during your pregnancy.  You may continue most exercises you were accustomed to prior to pregnancy.  Later in your pregnancy you will most likely notice you have difficulty with activities requiring balance like riding a bicycle.  It is important that you listen to your body and avoid activities that put you at a higher risk of falling.  Adequate rest and staying well hydrated are a must!  If you have questions about the safety of specific activities ask your provider.    Exposure  to Children with illness: Try to avoid obvious exposure; report any symptoms to [place] when noted,  If you have chicken pos, red measles or mumps, you should be immune to these diseases.   Please do not take any vaccines while pregnant unless you have checked with your OB provider.  Fetal Movement: After 28 weeks we recommend you do "kick counts" twice daily.  Lie or sit down in a calm quiet environment and count your baby movements "kicks".  You should feel your baby at least 10 times per hour.  If you have not felt 10 kicks within the first hour get up, walk around and have something sweet to eat or drink then repeat for an additional hour.  If count remains less than 10 per hour notify your provider.  Fumigating: Follow your pest control agent's advice as to how long to stay out of your home.  Ventilate the area well before re-entering.  Hemorrhoids:   Most over-the-counter preparations can be used during pregnancy.  Check your medication to see what is safe to use.  It is important to use a stool softener or fiber in your diet and to drink lots of liquids.  If hemorrhoids seem to be getting worse please call the office.   Hot Tubs:  Hot tubs Jacuzzis and saunas are not recommended while pregnant.  These increase your internal body temperature and should be avoided.  Intercourse:  Sexual intercourse is safe during pregnancy as long as you are comfortable, unless  otherwise advised by your provider.  Spotting may occur after intercourse; report any bright red bleeding that is heavier than spotting.  Labor:  If you know that you are in labor, please go to the hospital.  If you are unsure, please call the office and let us help you decide what to do.  Lifting, straining, etc:  If your job requires heavy lifting or straining please check with your provider for any limitations.  Generally, you should not lift items heavier than that you can lift simply with your hands and arms (no back muscles)  Painting:  Paint fumes do not harm your pregnancy, but may make you ill and should be avoided if possible.  Latex or water based paints have less odor than oils.  Use adequate ventilation while painting.  Permanents & Hair Color:  Chemicals in hair dyes are not recommended as they cause increase hair dryness which can increase hair loss during pregnancy.  " Highlighting" and permanents are allowed.  Dye may be absorbed differently and permanents may not hold as well during pregnancy.  Sunbathing:  Use a sunscreen, as skin burns easily during pregnancy.  Drink plenty of fluids; avoid over heating.  Tanning Beds:  Because their possible side effects are still unknown, tanning beds are not recommended.  Ultrasound Scans:  Routine ultrasounds are performed at approximately 20 weeks.  You will be able to see your baby's general anatomy an if you would like to know the gender this can usually be determined as well.  If it is questionable when you conceived you may also receive an ultrasound early in your pregnancy for dating purposes.  Otherwise ultrasound exams are not routinely performed unless there is a medical necessity.  Although you can request a scan we ask that you pay for it when conducted because insurance does not cover " patient request" scans.  Work: If your pregnancy proceeds without complications you may work until your due date, unless your physician or employer  advises  otherwise.   Common Medications Safe in Pregnancy  Acne:      Constipation:  Benzoyl Peroxide     Colace  Clindamycin      Dulcolax Suppository  Topica Erythromycin     Fibercon  Salicylic Acid      Metamucil         Miralax AVOID:        Senakot   Accutane    Cough:  Retin-A       Cough Drops  Tetracycline      Phenergan w/ Codeine if Rx  Minocycline      Robitussin (Plain & DM)  Antibiotics:     Crabs/Lice:  Ceclor       RID  Cephalosporins    AVOID:  E-Mycins      Kwell  Keflex  Macrobid/Macrodantin   Diarrhea:  Penicillin      Kao-Pectate  Zithromax      Imodium AD         PUSH FLUIDS AVOID:       Cipro     Fever:  Tetracycline      Tylenol (Regular or Extra  Minocycline       Strength)  Levaquin      Extra Strength-Do not          Exceed 8 tabs/24 hrs Caffeine:        200mg /day (equiv. To 1 cup of coffee or  approx. 3 12 oz sodas)         Gas: Cold/Hayfever:       Gas-X  Benadryl      Mylicon  Claritin       Phazyme  **Claritin-D        Chlor-Trimeton    Headaches:  Dimetapp      ASA-Free Excedrin  Drixoral-Non-Drowsy     Cold Compress  Mucinex (Guaifenasin)     Tylenol (Regular or Extra  Sudafed/Sudafed-12 Hour     Strength)  **Sudafed PE Pseudoephedrine   Tylenol Cold & Sinus     Vicks Vapor Rub  Zyrtec  **AVOID if Problems With Blood Pressure         Heartburn: Avoid lying down for at least 1 hour after meals  Aciphex      Maalox     Rash:  Milk of Magnesia     Benadryl    Mylanta       1% Hydrocortisone Cream  Pepcid  Pepcid Complete   Sleep Aids:  Prevacid      Ambien   Prilosec       Benadryl  Rolaids       Chamomile Tea  Tums (Limit 4/day)     Unisom         Tylenol PM         Warm milk-add vanilla or  Hemorrhoids:       Sugar for taste  Anusol/Anusol H.C.  (RX: Analapram 2.5%)  Sugar Substitutes:  Hydrocortisone OTC     Ok in moderation  Preparation H      Tucks        Vaseline lotion applied to tissue with  wiping    Herpes:     Throat:  Acyclovir      Oragel  Famvir  Valtrex     Vaccines:         Flu Shot Leg Cramps:       *Gardasil  Benadryl      Hepatitis A         Hepatitis  B Nasal Spray:       Pneumovax  Saline Nasal Spray     Polio Booster         Tetanus Nausea:       Tuberculosis test or PPD  Vitamin B6 25 mg TID   AVOID:    Dramamine      *Gardasil  Emetrol       Live Poliovirus  Ginger Root 250 mg QID    MMR (measles, mumps &  High Complex Carbs @ Bedtime    rebella)  Sea Bands-Accupressure    Varicella (Chickenpox)  Unisom 1/2 tab TID     *No known complications           If received before Pain:         Known pregnancy;   Darvocet       Resume series after  Lortab        Delivery  Percocet    Yeast:   Tramadol      Femstat  Tylenol 3      Gyne-lotrimin  Ultram       Monistat  Vicodin           MISC:         All Sunscreens           Hair Coloring/highlights          Insect Repellant's          (Including DEET)         Mystic Tans   Round Ligament Pain/Pelvic Discomfort:  Sharp, shooting pains not associated with bleeding are fairly common, usually occurring in the second trimester of pregnancy.  They tend to be worse when standing up or when you remain standing for long periods of time.  These are the result of pressure of certain pelvic ligaments called "round ligaments".  Rest, Tylenol and heat seem to be the most effective relief.  As the womb and fetus grow, they rise out of the pelvis and the discomfort improves.  Please notify the office if your pain seems different than that described.  It may represent a more serious condition.

## 2023-01-03 ENCOUNTER — Ambulatory Visit (INDEPENDENT_AMBULATORY_CARE_PROVIDER_SITE_OTHER): Payer: Medicaid Other

## 2023-01-03 VITALS — Wt 141.0 lb

## 2023-01-03 DIAGNOSIS — O09529 Supervision of elderly multigravida, unspecified trimester: Secondary | ICD-10-CM | POA: Insufficient documentation

## 2023-01-03 DIAGNOSIS — Z369 Encounter for antenatal screening, unspecified: Secondary | ICD-10-CM

## 2023-01-03 DIAGNOSIS — Z3689 Encounter for other specified antenatal screening: Secondary | ICD-10-CM

## 2023-01-03 DIAGNOSIS — Z3480 Encounter for supervision of other normal pregnancy, unspecified trimester: Secondary | ICD-10-CM | POA: Insufficient documentation

## 2023-01-03 NOTE — Progress Notes (Signed)
New OB Intake  I connected with  Tiffany Franco on 01/03/23 at  3:15 PM EDT by telephone and verified that I am speaking with the correct person using two identifiers. Nurse is located at [facility] and pt is located at home.  I explained I am completing New OB Intake today. We discussed her EDD of 08/03/2023 that is based on LMP of 10/27/2022. Pt is G4/P3004. I reviewed her allergies, medications, Medical/Surgical/OB history, and appropriate screenings. There are no cats in the home.   Based on history, this is a/an pregnancy uncomplicated .   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   Stomatitis 04/29/2022   Gross hematuria 11/04/2021   Anemia 11/04/2021   Abscess of lower back 10/01/2021   Mass of lower outer quadrant of right breast 10/01/2021   Encounter for elective induction of labor 08/23/2020   Labor and delivery, indication for care 08/15/2020   Indication for care in labor and delivery, antepartum 08/11/2020   Desires VBAC (vaginal birth after cesarean) trial 07/16/2020   Encounter for supervision of high-risk pregnancy with multigravida of advanced maternal age 88/06/2019   Previous cesarean section 02/27/2020   Family history of fraternal twins 02/27/2020   History of fourth degree perineal laceration 02/27/2020   Dermatofibroma 08/24/2017   Epidermal inclusion cyst 08/24/2017   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  Delivery Plans:  Plans to deliver at [facility].  Anatomy US Explained first scheduled [place] will be scheduled soon and an anatomy scan will be done at 20 weeks.  Labs Discussed genetic screening with patient. Patient undecided about having genetic testing to be drawn at new OB visit. Discussed possible labs to be drawn at new OB  appointment.  COVID Vaccine Patient has not had COVID vaccine.   Social Determinants of Health Food Insecurity: denies food insecurity Transportation: Patient denies transportation needs. Childcare: Discussed no children allowed at ultrasound appointments.   First visit review I reviewed new OB appt with pt. I explained she will have ob bloodwork and pap smear/pelvic exam if indicated. Explained pt will be seen by an AOB provider at first visit; encounter routed to appropriate provider.   Loran Senters, [facility] 01/03/2023  3:37 PM

## 2023-01-03 NOTE — Patient Instructions (Signed)
Second Trimester of Pregnancy  The second trimester of pregnancy is from week 13 through week 27. This is months 4 through 6 of pregnancy. The second trimester is often a time when you feel your best. Your body has adjusted to being pregnant, and you begin to feel better physically. During the second trimester: Morning sickness has lessened or stopped completely. You may have more energy. You may have an increase in appetite. The second trimester is also a time when the unborn baby (fetus) is growing rapidly. At the end of the sixth month, the fetus may be up to 12 inches long and weigh about 1 pounds. You will likely begin to feel the baby move (quickening) between 16 and 20 weeks of pregnancy. Body changes during your second trimester Your body continues to go through many changes during your second trimester. The changes vary and generally return to normal after the baby is born. Physical changes Your weight will continue to increase. You will notice your lower abdomen bulging out. You may begin to get stretch marks on your hips, abdomen, and breasts. Your breasts will continue to grow and to become tender. Dark spots or blotches (chloasma or mask of pregnancy) may develop on your face. A dark line from your belly button to the pubic area (linea nigra) may appear. You may have changes in your hair. These can include thickening of your hair, rapid growth, and changes in texture. Some people also have hair loss during or after pregnancy, or hair that feels dry or thin. Health changes You may develop headaches. You may have heartburn. You may develop constipation. You may develop hemorrhoids or swollen, bulging veins (varicose veins). Your gums may bleed and may be sensitive to brushing and flossing. You may urinate more often because the fetus is pressing on your bladder. You may have back pain. This is caused by: Weight gain. Pregnancy hormones that are relaxing the joints in your  pelvis. A shift in weight and the muscles that support your balance. Follow these instructions at home: Medicines Follow your health care provider's instructions regarding medicine use. Specific medicines may be either safe or unsafe to take during pregnancy. Do not take any medicines unless approved by your health care provider. Take a prenatal vitamin that contains at least 600 micrograms (mcg) of folic acid. Eating and drinking Eat a healthy diet that includes fresh fruits and vegetables, whole grains, good sources of protein such as meat, eggs, or tofu, and low-fat dairy products. Avoid raw meat and unpasteurized juice, milk, and cheese. These carry germs that can harm you and your baby. You may need to take these actions to prevent or treat constipation: Drink enough fluid to keep your urine pale yellow. Eat foods that are high in fiber, such as beans, whole grains, and fresh fruits and vegetables. Limit foods that are high in fat and processed sugars, such as fried or sweet foods. Activity Exercise only as directed by your health care provider. Most people can continue their usual exercise routine during pregnancy. Try to exercise for 30 minutes at least 5 days a week. Stop exercising if you develop contractions in your uterus. Stop exercising if you develop pain or cramping in the lower abdomen or lower back. Avoid exercising if it is very hot or humid or if you are at a high altitude. Avoid heavy lifting. If you choose to, you may have sex unless your health care provider tells you not to. Relieving pain and discomfort Wear a supportive   bra to prevent discomfort from breast tenderness. Take warm sitz baths to soothe any pain or discomfort caused by hemorrhoids. Use hemorrhoid cream if your health care provider approves. Rest with your legs raised (elevated) if you have leg cramps or low back pain. If you develop varicose veins: Wear support hose as told by your health care  provider. Elevate your feet for 15 minutes, 3-4 times a day. Limit salt in your diet. Safety Wear your seat belt at all times when driving or riding in a car. Talk with your health care provider if someone is verbally or physically abusive to you. Lifestyle Do not use hot tubs, steam rooms, or saunas. Do not douche. Do not use tampons or scented sanitary pads. Avoid cat litter boxes and soil used by cats. These carry germs that can cause birth defects in the baby and possibly loss of the fetus by miscarriage or stillbirth. Do not use herbal remedies, alcohol, illegal drugs, or medicines that are not approved by your health care provider. Chemicals in these products can harm your baby. Do not use any products that contain nicotine or tobacco, such as cigarettes, e-cigarettes, and chewing tobacco. If you need help quitting, ask your health care provider. General instructions During a routine prenatal visit, your health care provider will do a physical exam and other tests. He or she will also discuss your overall health. Keep all follow-up visits. This is important. Ask your health care provider for a referral to a local prenatal education class. Ask for help if you have counseling or nutritional needs during pregnancy. Your health care provider can offer advice or refer you to specialists for help with various needs. Where to find more information American Pregnancy Association: americanpregnancy.org American College of Obstetricians and Gynecologists: acog.org/en/Womens%20Health/Pregnancy Office on Women's Health: womenshealth.gov/pregnancy Contact a health care provider if you have: A headache that does not go away when you take medicine. Vision changes or you see spots in front of your eyes. Mild pelvic cramps, pelvic pressure, or nagging pain in the abdominal area. Persistent nausea, vomiting, or diarrhea. A bad-smelling vaginal discharge or foul-smelling urine. Pain when you  urinate. Sudden or extreme swelling of your face, hands, ankles, feet, or legs. A fever. Get help right away if you: Have fluid leaking from your vagina. Have spotting or bleeding from your vagina. Have severe abdominal cramping or pain. Have difficulty breathing. Have chest pain. Have fainting spells. Have not felt your baby move for the time period told by your health care provider. Have new or increased pain, swelling, or redness in an arm or leg. Summary The second trimester of pregnancy is from week 13 through week 27 (months 4 through 6). Do not use herbal remedies, alcohol, illegal drugs, or medicines that are not approved by your health care provider. Chemicals in these products can harm your baby. Exercise only as directed by your health care provider. Most people can continue their usual exercise routine during pregnancy. Keep all follow-up visits. This is important. This information is not intended to replace advice given to you by your health care provider. Make sure you discuss any questions you have with your health care provider. Document Revised: 11/21/2019 Document Reviewed: 09/27/2019 Elsevier Patient Education  2024 [employer] First Trimester of Pregnancy  The first trimester of pregnancy starts on the first day of your last menstrual period until the end of week 12. This is also called months 1 through 3 of pregnancy. Body changes during your first trimester Your   body goes through many changes during pregnancy. The changes usually return to normal after your baby is born. Physical changes You may gain or lose weight. Your breasts may grow larger and hurt. The area around your nipples may get darker. Dark spots or blotches may develop on your face. You may have changes in your hair. Health changes You may feel like you might vomit (nauseous), and you may vomit. You may have heartburn. You may have headaches. You may have trouble pooping (constipation). Your  gums may bleed. Other changes You may get tired easily. You may pee (urinate) more often. Your menstrual periods will stop. You may not feel hungry. You may want to eat certain kinds of food. You may have changes in your emotions from day to day. You may have more dreams. Follow these instructions at home: Medicines Take over-the-counter and prescription medicines only as told by your doctor. Some medicines are not safe during pregnancy. Take a prenatal vitamin that contains at least 600 micrograms (mcg) of folic acid. Eating and drinking Eat healthy meals that include: Fresh fruits and vegetables. Whole grains. Good sources of protein, such as meat, eggs, or tofu. Low-fat dairy products. Avoid raw meat and unpasteurized juice, milk, and cheese. If you feel like you may vomit, or you vomit: Eat 4 or 5 small meals a day instead of 3 large meals. Try eating a few soda crackers. Drink liquids between meals instead of during meals. You may need to take these actions to prevent or treat trouble pooping: Drink enough fluids to keep your pee (urine) pale yellow. Eat foods that are high in fiber. These include beans, whole grains, and fresh fruits and vegetables. Limit foods that are high in fat and sugar. These include fried or sweet foods. Activity Exercise only as told by your doctor. Most people can do their usual exercise routine during pregnancy. Stop exercising if you have cramps or pain in your lower belly (abdomen) or low back. Do not exercise if it is too hot or too humid, or if you are in a place of great height (high altitude). Avoid heavy lifting. If you choose to, you may have sex unless your doctor tells you not to. Relieving pain and discomfort Wear a good support bra if your breasts are sore. Rest with your legs raised (elevated) if you have leg cramps or low back pain. If you have bulging veins (varicose veins) in your legs: Wear support hose as told by your  doctor. Raise your feet for 15 minutes, 3-4 times a day. Limit salt in your food. Safety Wear your seat belt at all times when you are in a car. Talk with your doctor if someone is hurting you or yelling at you. Talk with your doctor if you are feeling sad or have thoughts of hurting yourself. Lifestyle Do not use hot tubs, steam rooms, or saunas. Do not douche. Do not use tampons or scented sanitary pads. Do not use herbal medicines, illegal drugs, or medicines that are not approved by your doctor. Do not drink alcohol. Do not smoke or use any products that contain nicotine or tobacco. If you need help quitting, ask your doctor. Avoid cat litter boxes and soil that is used by cats. These carry germs that can cause harm to the baby and can cause a loss of your baby by miscarriage or stillbirth. General instructions Keep all follow-up visits. This is important. Ask for help if you need counseling or if you need help with   nutrition. Your doctor can give you advice or tell you where to go for help. Visit your dentist. At home, brush your teeth with a soft toothbrush. Floss gently. Write down your questions. Take them to your prenatal visits. Where to find more information American Pregnancy Association: americanpregnancy.org American College of Obstetricians and Gynecologists: www.acog.org Office on Women's Health: womenshealth.gov/pregnancy Contact a doctor if: You are dizzy. You have a fever. You have mild cramps or pressure in your lower belly. You have a nagging pain in your belly area. You continue to feel like you may vomit, you vomit, or you have watery poop (diarrhea) for 24 hours or longer. You have a bad-smelling fluid coming from your vagina. You have pain when you pee. You are exposed to a disease that spreads from person to person, such as chickenpox, measles, Zika virus, HIV, or hepatitis. Get help right away if: You have spotting or bleeding from your vagina. You have  very bad belly cramping or pain. You have shortness of breath or chest pain. You have any kind of injury, such as from a fall or a car crash. You have new or increased pain, swelling, or redness in an arm or leg. Summary The first trimester of pregnancy starts on the first day of your last menstrual period until the end of week 12 (months 1 through 3). Eat 4 or 5 small meals a day instead of 3 large meals. Do not smoke or use any products that contain nicotine or tobacco. If you need help quitting, ask your doctor. Keep all follow-up visits. This information is not intended to replace advice given to you by your health care provider. Make sure you discuss any questions you have with your health care provider. Document Revised: 11/21/2019 Document Reviewed: 09/27/2019 Elsevier Patient Education  2024 [employer] Commonly Asked Questions During Pregnancy  Cats: A parasite can be excreted in cat feces.  To avoid exposure you need to have another person empty the little box.  If you must empty the litter box you will need to wear gloves.  Wash your hands after handling your cat.  This parasite can also be found in raw or undercooked meat so this should also be avoided.  Colds, Sore Throats, Flu: Please check your medication sheet to see what you can take for symptoms.  If your symptoms are unrelieved by these medications please call the office.  Dental Work: Most any dental work your dentist recommends is permitted.  X-rays should only be taken during the first trimester if absolutely necessary.  Your abdomen should be shielded with a lead apron during all x-rays.  Please notify your provider prior to receiving any x-rays.  Novocaine is fine; gas is not recommended.  If your dentist requires a note from us prior to dental work please call the office and we will provide one for you.  Exercise: Exercise is an important part of staying healthy during your pregnancy.  You may continue most exercises  you were accustomed to prior to pregnancy.  Later in your pregnancy you will most likely notice you have difficulty with activities requiring balance like riding a bicycle.  It is important that you listen to your body and avoid activities that put you at a higher risk of falling.  Adequate rest and staying well hydrated are a must!  If you have questions about the safety of specific activities ask your provider.    Exposure to Children with illness: Try to avoid obvious exposure; report any   symptoms to us when noted,  If you have chicken pos, red measles or mumps, you should be immune to these diseases.   Please do not take any vaccines while pregnant unless you have checked with your OB provider.  Fetal Movement: After 28 weeks we recommend you do "kick counts" twice daily.  Lie or sit down in a calm quiet environment and count your baby movements "kicks".  You should feel your baby at least 10 times per hour.  If you have not felt 10 kicks within the first hour get up, walk around and have something sweet to eat or drink then repeat for an additional hour.  If count remains less than 10 per hour notify your provider.  Fumigating: Follow your pest control agent's advice as to how long to stay out of your home.  Ventilate the area well before re-entering.  Hemorrhoids:   Most over-the-counter preparations can be used during pregnancy.  Check your medication to see what is safe to use.  It is important to use a stool softener or fiber in your diet and to drink lots of liquids.  If hemorrhoids seem to be getting worse please call the office.   Hot Tubs:  Hot tubs Jacuzzis and saunas are not recommended while pregnant.  These increase your internal body temperature and should be avoided.  Intercourse:  Sexual intercourse is safe during pregnancy as long as you are comfortable, unless otherwise advised by your provider.  Spotting may occur after intercourse; report any bright red bleeding that is heavier  than spotting.  Labor:  If you know that you are in labor, please go to the hospital.  If you are unsure, please call the office and let us help you decide what to do.  Lifting, straining, etc:  If your job requires heavy lifting or straining please check with your provider for any limitations.  Generally, you should not lift items heavier than that you can lift simply with your hands and arms (no back muscles)  Painting:  Paint fumes do not harm your pregnancy, but may make you ill and should be avoided if possible.  Latex or water based paints have less odor than oils.  Use adequate ventilation while painting.  Permanents & Hair Color:  Chemicals in hair dyes are not recommended as they cause increase hair dryness which can increase hair loss during pregnancy.  " Highlighting" and permanents are allowed.  Dye may be absorbed differently and permanents may not hold as well during pregnancy.  Sunbathing:  Use a sunscreen, as skin burns easily during pregnancy.  Drink plenty of fluids; avoid over heating.  Tanning Beds:  Because their possible side effects are still unknown, tanning beds are not recommended.  Ultrasound Scans:  Routine ultrasounds are performed at approximately 20 weeks.  You will be able to see your baby's general anatomy an if you would like to know the gender this can usually be determined as well.  If it is questionable when you conceived you may also receive an ultrasound early in your pregnancy for dating purposes.  Otherwise ultrasound exams are not routinely performed unless there is a medical necessity.  Although you can request a scan we ask that you pay for it when conducted because insurance does not cover " patient request" scans.  Work: If your pregnancy proceeds without complications you may work until your due date, unless your physician or employer advises otherwise.  Round Ligament Pain/Pelvic Discomfort:  Sharp, shooting pains not associated   with bleeding are  fairly common, usually occurring in the second trimester of pregnancy.  They tend to be worse when standing up or when you remain standing for long periods of time.  These are the result of pressure of certain pelvic ligaments called "round ligaments".  Rest, Tylenol and heat seem to be the most effective relief.  As the womb and fetus grow, they rise out of the pelvis and the discomfort improves.  Please notify the office if your pain seems different than that described.  It may represent a more serious condition.  Common Medications Safe in Pregnancy  Acne:      Constipation:  Benzoyl Peroxide     Colace  Clindamycin      Dulcolax Suppository  Topica Erythromycin     Fibercon  Salicylic Acid      Metamucil         Miralax AVOID:        Senakot   Accutane    Cough:  Retin-A       Cough Drops  Tetracycline      Phenergan w/ Codeine if Rx  Minocycline      Robitussin (Plain & DM)  Antibiotics:     Crabs/Lice:  Ceclor       RID  Cephalosporins    AVOID:  E-Mycins      Kwell  Keflex  Macrobid/Macrodantin   Diarrhea:  Penicillin      Kao-Pectate  Zithromax      Imodium AD         PUSH FLUIDS AVOID:       Cipro     Fever:  Tetracycline      Tylenol (Regular or Extra  Minocycline       Strength)  Levaquin      Extra Strength-Do not          Exceed 8 tabs/24 hrs Caffeine:        <200mg/day (equiv. To 1 cup of coffee or  approx. 3 12 oz sodas)         Gas: Cold/Hayfever:       Gas-X  Benadryl      Mylicon  Claritin       Phazyme  **Claritin-D        Chlor-Trimeton    Headaches:  Dimetapp      ASA-Free Excedrin  Drixoral-Non-Drowsy     Cold Compress  Mucinex (Guaifenasin)     Tylenol (Regular or Extra  Sudafed/Sudafed-12 Hour     Strength)  **Sudafed PE Pseudoephedrine   Tylenol Cold & Sinus     Vicks Vapor Rub  Zyrtec  **AVOID if Problems With Blood Pressure         Heartburn: Avoid lying down for at least 1 hour after meals  Aciphex      Maalox     Rash:  Milk of  Magnesia     Benadryl    Mylanta       1% Hydrocortisone Cream  Pepcid  Pepcid Complete   Sleep Aids:  Prevacid      Ambien   Prilosec       Benadryl  Rolaids       Chamomile Tea  Tums (Limit 4/day)     Unisom         Tylenol PM         Warm milk-add vanilla or  Hemorrhoids:       Sugar for taste  Anusol/Anusol H.C.  (RX: Analapram 2.5%)  Sugar Substitutes:    Hydrocortisone OTC     Ok in moderation  Preparation H      Tucks        Vaseline lotion applied to tissue with wiping    Herpes:     Throat:  Acyclovir      Oragel  Famvir  Valtrex     Vaccines:         Flu Shot Leg Cramps:       *Gardasil  Benadryl      Hepatitis A         Hepatitis B Nasal Spray:       Pneumovax  Saline Nasal Spray     Polio Booster         Tetanus Nausea:       Tuberculosis test or PPD  Vitamin B6 25 mg TID   AVOID:    Dramamine      *Gardasil  Emetrol       Live Poliovirus  Ginger Root 250 mg QID    MMR (measles, mumps &  High Complex Carbs @ Bedtime    rebella)  Sea Bands-Accupressure    Varicella (Chickenpox)  Unisom 1/2 tab TID     *No known complications           If received before Pain:         Known pregnancy;   Darvocet       Resume series after  Lortab        Delivery  Percocet    Yeast:   Tramadol      Femstat  Tylenol 3      Gyne-lotrimin  Ultram       Monistat  Vicodin           MISC:         All Sunscreens           Hair Coloring/highlights          Insect Repellant's          (Including DEET)         Mystic Tans  

## 2023-01-06 ENCOUNTER — Ambulatory Visit: Payer: Medicaid Other

## 2023-01-17 ENCOUNTER — Other Ambulatory Visit: Payer: Self-pay

## 2023-01-17 ENCOUNTER — Ambulatory Visit
Admission: RE | Admit: 2023-01-17 | Discharge: 2023-01-17 | Disposition: A | Discharge status: Home / Self Care | Payer: Medicaid Other | Source: Ambulatory Visit

## 2023-01-17 DIAGNOSIS — Z3480 Encounter for supervision of other normal pregnancy, unspecified trimester: Secondary | ICD-10-CM

## 2023-01-17 DIAGNOSIS — Z369 Encounter for antenatal screening, unspecified: Secondary | ICD-10-CM

## 2023-01-17 DIAGNOSIS — Z3687 Encounter for antenatal screening for uncertain dates: Secondary | ICD-10-CM | POA: Insufficient documentation

## 2023-01-17 DIAGNOSIS — Z3A11 11 weeks gestation of pregnancy: Secondary | ICD-10-CM | POA: Diagnosis not present

## 2023-01-17 DIAGNOSIS — D252 Subserosal leiomyoma of uterus: Secondary | ICD-10-CM | POA: Insufficient documentation

## 2023-01-17 DIAGNOSIS — O3411 Maternal care for benign tumor of corpus uteri, first trimester: Secondary | ICD-10-CM | POA: Diagnosis not present

## 2023-01-17 DIAGNOSIS — O09511 Supervision of elderly primigravida, first trimester: Secondary | ICD-10-CM | POA: Diagnosis not present

## 2023-01-17 DIAGNOSIS — D251 Intramural leiomyoma of uterus: Secondary | ICD-10-CM | POA: Diagnosis not present

## 2023-01-19 DIAGNOSIS — D259 Leiomyoma of uterus, unspecified: Secondary | ICD-10-CM | POA: Insufficient documentation

## 2023-02-21 ENCOUNTER — Encounter: Payer: Medicaid Other | Admitting: Advanced Practice Midwife

## 2023-03-02 ENCOUNTER — Ambulatory Visit (INDEPENDENT_AMBULATORY_CARE_PROVIDER_SITE_OTHER): Payer: Medicaid Other | Admitting: Obstetrics

## 2023-03-02 ENCOUNTER — Other Ambulatory Visit (HOSPITAL_COMMUNITY)
Admission: RE | Admit: 2023-03-02 | Discharge: 2023-03-02 | Disposition: A | Discharge status: Home / Self Care | Payer: Medicaid Other | Source: Ambulatory Visit | Attending: Advanced Practice Midwife | Admitting: Advanced Practice Midwife

## 2023-03-02 VITALS — BP 106/68 | HR 75 | Wt 151.0 lb

## 2023-03-02 DIAGNOSIS — Z3689 Encounter for other specified antenatal screening: Secondary | ICD-10-CM

## 2023-03-02 DIAGNOSIS — Z3A18 18 weeks gestation of pregnancy: Secondary | ICD-10-CM | POA: Diagnosis not present

## 2023-03-02 DIAGNOSIS — Z0283 Encounter for blood-alcohol and blood-drug test: Secondary | ICD-10-CM

## 2023-03-02 DIAGNOSIS — O09522 Supervision of elderly multigravida, second trimester: Secondary | ICD-10-CM | POA: Diagnosis not present

## 2023-03-02 DIAGNOSIS — Z1379 Encounter for other screening for genetic and chromosomal anomalies: Secondary | ICD-10-CM | POA: Diagnosis not present

## 2023-03-02 DIAGNOSIS — Z124 Encounter for screening for malignant neoplasm of cervix: Secondary | ICD-10-CM | POA: Insufficient documentation

## 2023-03-02 DIAGNOSIS — Z3482 Encounter for supervision of other normal pregnancy, second trimester: Secondary | ICD-10-CM

## 2023-03-02 DIAGNOSIS — Z130 Encounter for screening for diseases of the blood and blood-forming organs and certain disorders involving the immune mechanism: Secondary | ICD-10-CM | POA: Diagnosis not present

## 2023-03-02 DIAGNOSIS — Z113 Encounter for screening for infections with a predominantly sexual mode of transmission: Secondary | ICD-10-CM

## 2023-03-02 DIAGNOSIS — Z0184 Encounter for antibody response examination: Secondary | ICD-10-CM

## 2023-03-02 DIAGNOSIS — Z3480 Encounter for supervision of other normal pregnancy, unspecified trimester: Secondary | ICD-10-CM | POA: Diagnosis present

## 2023-03-02 NOTE — Progress Notes (Signed)
New Obstetric Patient H&P    Chief Complaint: "Desires prenatal care"   History of Present Illness: Patient is a 37 y.o. Q6V7846 Not Hispanic or Latino female, LMP 10/27/2022 presents with amenorrhea and positive home pregnancy test. Based on her  LMP, her EDD is Estimated Date of Delivery: 08/03/23 and her EGA is [redacted]w[redacted]d. Cycles are 5 days, regular, and occur approximately every : 28 days. Her last pap smear was a few years ago and was no abnormalities.    She had a urine pregnancy test which was positive 8 week(s)  ago. Her last menstrual period was normal and lasted for  5 day(s). Since her LMP she claims she has experienced fatigue. She denies vaginal bleeding. Her past medical history is noncontributory. Her prior pregnancies are notable for multiple gestation, and a 10 lb baby at her last delivery with a 4th degree laceration.  Since her LMP, she admits to the use of tobacco products  no She claims she has gained    14  pounds since the start of her pregnancy.  There are cats in the home in the home  no  She admits close contact with children on a regular basis  yes  She has had chicken pox in the past yes She has had Tuberculosis exposures, symptoms, or previously tested positive for TB   no Current or past history of domestic violence. no  Genetic Screening/Teratology Counseling: (Includes patient, baby's father, or anyone in either family with:)   1. Patient's age >/= 41 at [facility]  yes 2. Thalassemia ([place], [place], Mediterranean, or Asian background): MCV<80  no 3. Neural tube defect (meningomyelocele, spina bifida, anencephaly)  no 4. Congenital heart defect  no  5. Down syndrome  no 6. Tay-Sachs (Jewish, [place]  no 7. Canavan's Disease  no 8. Sickle cell disease or trait (African)  no  9. Hemophilia or other blood disorders  no  10. Muscular dystrophy  no  11. Cystic fibrosis  she reports thinking that she tested + for being a carrier, but is uncertain- will do  SMA test today  12. Huntington's Chorea  no  13. Mental retardation/autism  no 14. Other inherited genetic or chromosomal disorder  no 15. Maternal metabolic disorder (DM, PKU, etc)  no 16. Patient or FOB with a child with a birth defect not listed above no  16a. Patient or FOB with a birth defect themselves no 17. Recurrent pregnancy loss, or stillbirth  no  18. Any medications since LMP other than prenatal vitamins (include vitamins, supplements, OTC meds, drugs, alcohol)  no 19. Any other genetic/environmental exposure to discuss  no  Infection History:   1. Lives with someone with TB or TB exposed  no  2. Patient or partner has history of genital herpes  no 3. Rash or viral illness since LMP  no 4. History of STI (GC, CT, HPV, syphilis, HIV)  no 5. History of recent travel :  no  Other pertinent information:  She has had twins. Hr last baby weighed 10 lbs.     Review of Systems:10 point review of systems negative unless otherwise noted in HPI  Past Medical History:  Past Medical History:  Diagnosis Date   Anxiety    Encounter for supervision of high-risk pregnancy with multigravida of advanced maternal age 90/06/2019   Recurrent UTI     Past Surgical History:  Past Surgical History:  Procedure Laterality Date   CESAREAN SECTION  Gynecologic History: Patient's last menstrual period was 10/27/2022 (approximate).  Obstetric History: [id number]  Family History:  Family History  Problem Relation Age of Onset   Healthy Mother    Healthy Father    Healthy Sister    Healthy Sister    Healthy Sister    Healthy Sister    Healthy Brother    Healthy Brother    Healthy Brother    Healthy Brother    Breast cancer Maternal Grandmother    Diabetes Maternal Grandmother    Healthy Maternal Grandfather    Breast cancer Paternal Grandmother    Healthy Paternal Grandfather     Social History:  Social History   Socioeconomic History   Marital status: Married     Spouse name: Psychiatrist   Number of children: 4   Years of education: 16   Highest education level: Not on file  Occupational History   Occupation: stay at home Mom  Tobacco Use   Smoking status: Never   Smokeless tobacco: Never  Vaping Use   Vaping status: Never Used  Substance and Sexual Activity   Alcohol use: Never   Drug use: Never   Sexual activity: Yes    Partners: Male    Birth control/protection: Surgical, None  Other Topics Concern   Not on file  Social History Narrative   Not on file   Social Determinants of Health   Financial Resource Strain: Low Risk  (01/03/2023)   Overall Financial Resource Strain (CARDIA)    Difficulty of Paying Living Expenses: Not very hard  Food Insecurity: No Food Insecurity (01/03/2023)   Hunger Vital Sign    Worried About Running Out of Food in the Last Year: Never true    Ran Out of Food in the Last Year: Never true  Transportation Needs: No Transportation Needs (01/03/2023)   PRAPARE - Administrator, Civil Service (Medical): No    Lack of Transportation (Non-Medical): No  Physical Activity: Inactive (01/03/2023)   Exercise Vital Sign    Days of Exercise per Week: 0 days    Minutes of Exercise per Session: 0 min  Stress: No Stress Concern Present (01/03/2023)   [employer] of Occupational Health - Occupational Stress Questionnaire    Feeling of Stress : Not at all  Social Connections: Socially Integrated (01/03/2023)   Social Connection and Isolation Panel [NHANES]    Frequency of Communication with Friends and Family: More than three times a week    Frequency of Social Gatherings with Friends and Family: More than three times a week    Attends Religious Services: More than 4 times per year    Active Member of [employer] or Organizations: Yes    Attends Engineer, structural: More than 4 times per year    Marital Status: Married  Catering manager Violence: Not At Risk (01/03/2023)   Humiliation, Afraid, Rape, and Kick  questionnaire    Fear of Current or Ex-Partner: No    Emotionally Abused: No    Physically Abused: No    Sexually Abused: No    Allergies:  No Known Allergies  Medications: Prior to Admission medications   Medication Sig Start Date End Date Taking? Authorizing Provider  prenatal vitamin w/FE, FA (NATACHEW) 29-1 MG CHEW chewable tablet Chew 1 tablet by mouth daily at 12 noon.   Yes [provider]    Physical Exam Vitals: Blood pressure 106/68, pulse 75, weight 151 lb (68.5 kg), last menstrual period 10/27/2022, currently breastfeeding.  General:  NAD HEENT: normocephalic, anicteric Thyroid: no enlargement, no palpable nodules Pulmonary: No increased work of breathing, CTAB Cardiovascular: RRR, distal pulses 2+ Abdomen: NABS, soft, non-tender, non-distended.  Umbilicus without lesions.  No hepatomegaly, splenomegaly or masses palpable. No evidence of hernia  Genitourinary:  External: Normal external female genitalia.  Normal urethral meatus, normal  Bartholin's and Skene's glands.    Vagina: Normal vaginal mucosa, no evidence of prolapse.    Cervix: Grossly normal in appearance, no bleeding  Uterus: enlarged- consistent with [redacted] week gestation. Non-enlarged, mobile, normal contour.  No CMT  Adnexa: ovaries non-enlarged, no adnexal masses  Rectal: deferred Extremities: no edema, erythema, or tenderness Neurologic: Grossly intact Psychiatric: mood appropriate, affect full   Assessment: 37 y.o. [id number] at [redacted]w[redacted]d presenting to initiate prenatal care Hx of twins delivered via CS Hx of macrosomic infant Desires VBAC AMA   Plan: 1) Avoid alcoholic beverages. 2) Patient encouraged not to smoke.  3) Discontinue the use of all non-medicinal drugs and chemicals.  4) Take prenatal vitamins daily.  5) Nutrition, food safety (fish, cheese advisories, and high nitrite foods) and exercise discussed. 6) Hospital and practice style discussed with cross coverage system.  7)  Genetic Screening, such as with 1st Trimester Screening, cell free fetal DNA, AFP testing, and Ultrasound, as well as with amniocentesis and CVS as appropriate, is discussed with patient. At the conclusion of today's visit patient requested genetic testing 8) Patient is asked about travel to areas at risk for the Zika virus, and counseled to avoid travel and exposure to mosquitoes or sexual partners who may have themselves been exposed to the virus. Testing is discussed, and will be ordered as appropriate.   I have ordered her anatomy scan. She will have the SMA test panel. RTC in 2 weeks for f/u ROB and scan  Mirna Mires, CNM  03/02/2023 1:04 PM

## 2023-03-03 LAB — MICROSCOPIC EXAMINATION: Casts: NONE SEEN /LPF

## 2023-03-03 LAB — URINALYSIS, ROUTINE W REFLEX MICROSCOPIC
Bilirubin, UA: NEGATIVE
Glucose, UA: NEGATIVE
Ketones, UA: NEGATIVE
Nitrite, UA: NEGATIVE
RBC, UA: NEGATIVE
Specific Gravity, UA: 1.021 (ref 1.005–1.030)
Urobilinogen, Ur: 0.2 mg/dL (ref 0.2–1.0)
pH, UA: 6 (ref 5.0–7.5)

## 2023-03-04 LAB — CBC/D/PLT+RPR+RH+ABO+RUBIGG...
Antibody Screen: NEGATIVE
Basophils Absolute: 0 10*3/uL (ref 0.0–0.2)
Basos: 0 %
EOS (ABSOLUTE): 0.3 10*3/uL (ref 0.0–0.4)
Eos: 3 %
HCV Ab: NONREACTIVE
HIV Screen 4th Generation wRfx: NONREACTIVE
Hematocrit: 36.9 % (ref 34.0–46.6)
Hemoglobin: 12.1 g/dL (ref 11.1–15.9)
Hepatitis B Surface Ag: NEGATIVE
Immature Grans (Abs): 0.1 10*3/uL (ref 0.0–0.1)
Immature Granulocytes: 1 %
Lymphocytes Absolute: 1.6 10*3/uL (ref 0.7–3.1)
Lymphs: 19 %
MCH: 30.8 pg (ref 26.6–33.0)
MCHC: 32.8 g/dL (ref 31.5–35.7)
MCV: 94 fL (ref 79–97)
Monocytes Absolute: 0.6 10*3/uL (ref 0.1–0.9)
Monocytes: 7 %
Neutrophils Absolute: 6.1 10*3/uL (ref 1.4–7.0)
Neutrophils: 70 %
Platelets: 242 10*3/uL (ref 150–450)
RBC: 3.93 x10E6/uL (ref 3.77–5.28)
RDW: 12.2 % (ref 11.7–15.4)
RPR Ser Ql: NONREACTIVE
Rh Factor: POSITIVE
Rubella Antibodies, IGG: 1.48 {index} (ref >0.99)
Varicella zoster IgG: 641 {index} (ref >165)
WBC: 8.6 10*3/uL (ref 3.4–10.8)

## 2023-03-04 LAB — MONITOR DRUG PROFILE 14(MW)
Amphetamine Scrn, Ur: NEGATIVE ng/mL
BARBITURATE SCREEN URINE: NEGATIVE ng/mL
BENZODIAZEPINE SCREEN, URINE: NEGATIVE ng/mL
Buprenorphine, Urine: NEGATIVE ng/mL
CANNABINOIDS UR QL SCN: NEGATIVE ng/mL
Cocaine (Metab) Scrn, Ur: NEGATIVE ng/mL
Creatinine(Crt), U: 83.5 mg/dL (ref 20.0–300.0)
Fentanyl, Urine: NEGATIVE pg/mL
Meperidine Screen, Urine: NEGATIVE ng/mL
Methadone Screen, Urine: NEGATIVE ng/mL
OXYCODONE+OXYMORPHONE UR QL SCN: NEGATIVE ng/mL
Opiate Scrn, Ur: NEGATIVE ng/mL
Ph of Urine: 6 (ref 4.5–8.9)
Phencyclidine Qn, Ur: NEGATIVE ng/mL
Propoxyphene Scrn, Ur: NEGATIVE ng/mL
SPECIFIC GRAVITY: 1.015
Tramadol Screen, Urine: NEGATIVE ng/mL

## 2023-03-04 LAB — AFP, SERUM, OPEN SPINA BIFIDA
AFP MoM: 0.85
AFP Value: 36.2 ng/mL
Gest. Age on Collection Date: 18 wk
Maternal Age At EDD: 38.8 a
OSBR Risk 1 IN: 10000
Test Results:: NEGATIVE
Weight: 151 [lb_av]

## 2023-03-04 LAB — CERVICOVAGINAL ANCILLARY ONLY
Bacterial Vaginitis (gardnerella): NEGATIVE
Candida Glabrata: NEGATIVE
Candida Vaginitis: NEGATIVE
Chlamydia: NEGATIVE
Comment: NEGATIVE
Comment: NEGATIVE
Comment: NEGATIVE
Comment: NEGATIVE
Comment: NEGATIVE
Comment: NORMAL
Neisseria Gonorrhea: NEGATIVE
Trichomonas: NEGATIVE

## 2023-03-04 LAB — HGB FRACTIONATION CASCADE
Hgb A2: 2.6 % (ref 1.8–3.2)
Hgb A: 97.4 % (ref 96.4–98.8)
Hgb F: 0 % (ref 0.0–2.0)
Hgb S: 0 %

## 2023-03-04 LAB — NICOTINE SCREEN, URINE: Cotinine Ql Scrn, Ur: NEGATIVE ng/mL

## 2023-03-04 LAB — HCV INTERPRETATION

## 2023-03-06 LAB — MATERNIT 21 PLUS CORE, BLOOD
Fetal Fraction: 9
Result (T21): NEGATIVE
Trisomy 13 (Patau syndrome): NEGATIVE
Trisomy 18 (Edwards syndrome): NEGATIVE
Trisomy 21 (Down syndrome): NEGATIVE

## 2023-03-06 LAB — CULTURE, OB URINE

## 2023-03-06 LAB — URINE CULTURE, OB REFLEX

## 2023-03-08 LAB — CYTOLOGY - PAP
Comment: NEGATIVE
Diagnosis: NEGATIVE
Diagnosis: REACTIVE
High risk HPV: NEGATIVE

## 2023-03-16 ENCOUNTER — Ambulatory Visit (INDEPENDENT_AMBULATORY_CARE_PROVIDER_SITE_OTHER): Payer: Medicaid Other

## 2023-03-16 DIAGNOSIS — Z3A20 20 weeks gestation of pregnancy: Secondary | ICD-10-CM | POA: Diagnosis not present

## 2023-03-16 DIAGNOSIS — Z3689 Encounter for other specified antenatal screening: Secondary | ICD-10-CM

## 2023-03-16 DIAGNOSIS — Z3A18 18 weeks gestation of pregnancy: Secondary | ICD-10-CM

## 2023-03-19 ENCOUNTER — Encounter: Payer: Self-pay | Admitting: Obstetrics and Gynecology

## 2023-03-19 DIAGNOSIS — O09299 Supervision of pregnancy with other poor reproductive or obstetric history, unspecified trimester: Secondary | ICD-10-CM | POA: Insufficient documentation

## 2023-03-23 ENCOUNTER — Encounter: Payer: Medicaid Other | Admitting: Licensed Practical Nurse

## 2023-03-29 ENCOUNTER — Encounter: Payer: Medicaid Other | Admitting: Licensed Practical Nurse

## 2023-04-07 ENCOUNTER — Ambulatory Visit (INDEPENDENT_AMBULATORY_CARE_PROVIDER_SITE_OTHER): Payer: Medicaid Other | Admitting: Obstetrics and Gynecology

## 2023-04-07 ENCOUNTER — Encounter: Payer: Self-pay | Admitting: Obstetrics and Gynecology

## 2023-04-07 VITALS — BP 102/65 | HR 76 | Wt 161.0 lb

## 2023-04-07 DIAGNOSIS — Z131 Encounter for screening for diabetes mellitus: Secondary | ICD-10-CM

## 2023-04-07 DIAGNOSIS — O0932 Supervision of pregnancy with insufficient antenatal care, second trimester: Secondary | ICD-10-CM | POA: Insufficient documentation

## 2023-04-07 DIAGNOSIS — Z3482 Encounter for supervision of other normal pregnancy, second trimester: Secondary | ICD-10-CM

## 2023-04-07 DIAGNOSIS — Z98891 History of uterine scar from previous surgery: Secondary | ICD-10-CM | POA: Insufficient documentation

## 2023-04-07 DIAGNOSIS — Z3A23 23 weeks gestation of pregnancy: Secondary | ICD-10-CM

## 2023-04-07 DIAGNOSIS — G8929 Other chronic pain: Secondary | ICD-10-CM

## 2023-04-07 DIAGNOSIS — Z113 Encounter for screening for infections with a predominantly sexual mode of transmission: Secondary | ICD-10-CM

## 2023-04-07 DIAGNOSIS — Z130 Encounter for screening for diseases of the blood and blood-forming organs and certain disorders involving the immune mechanism: Secondary | ICD-10-CM

## 2023-04-07 LAB — POCT URINALYSIS DIPSTICK OB
Bilirubin, UA: NEGATIVE
Glucose, UA: NEGATIVE
Ketones, UA: NEGATIVE
Leukocytes, UA: NEGATIVE
Nitrite, UA: NEGATIVE
POC,PROTEIN,UA: NEGATIVE
Spec Grav, UA: 1.015 (ref 1.010–1.025)
Urobilinogen, UA: 0.2 U/dL
pH, UA: 7 (ref 5.0–8.0)

## 2023-04-07 NOTE — Addendum Note (Signed)
Addended by: Fabian November on: 04/07/2023 05:03 PM   Modules accepted: Orders

## 2023-04-07 NOTE — Progress Notes (Signed)
ROB [redacted]w[redacted]d: She is doing well. She reports good fetal movement. She is concerned about her weight gain. She gained 10 lbs in 1 month.

## 2023-04-07 NOTE — Progress Notes (Signed)
ROB: Patient is a 37 y.o. [id number] at [redacted]w[redacted]d who presents for routine OB care.  Pregnancy is complicated by:   Patient Active Problem List   Diagnosis Date Noted   History of macrosomia in infant in prior pregnancy, currently pregnant 03/19/2023   Uterine fibroid 01/19/2023   Supervision of high-risk pregnancy of elderly multigravida 01/03/2023   Anemia 11/04/2021   Desires VBAC (vaginal birth after cesarean) trial 07/16/2020   Previous cesarean section 02/27/2020   Family history of fraternal twins 02/27/2020   History of fourth degree perineal laceration 02/27/2020   Recurrent UTI 08/24/2017   Depression 03/10/2014      Patient has complaints of tailbone pain. Reports h/o tailbone injury prior to pregnancy, and now is experiencing more pain. Discussed comfort measures including using support pillow (doughnut). Also can refer to physical therapy, or can utilize a chiropractor during pregnancy for further alleviation of pain during her pregnancy. Will place referral.  Patient also concerned about 10 lbs weight gain since last visit. Denies any excessive dietary consumption, also feels that she is very active with her 4 kids and additionally has purchased a "walking pad" so is unclear for why she rapidly gained weight. Discussed possible causes including rapid increase in fluid accumulation in 2nd trimester, increased fetal growth as she also has a h/o macrosomia (almost 10 lb infant last pregnancy).  Advised that we will continue to monitor her weight gain, as well as her BP and fetal growth this pregnancy. Briefly discussed plans for delivery, has had successful VBAC last pregnancy. Patient undecided at this time, notes she will defer until later in the pregnancy depending on the size of current fetus. If large, would prefer C-section. Would also like to consider BTL at that time, otherwise plans for partner vasectomy.  Can sign tubal papers next visit if certain. Normal anatomy scan. RTC in 4 weeks,  for 28 week labs and BTL consent. Can repeat TOLAC consent in 3rd trimester or schedule for C-section in later 3rd trimester. Declines flu vaccine.

## 2023-04-26 IMAGING — MG DIGITAL DIAGNOSTIC BILAT W/ TOMO W/ CAD
8 of 13 series · 8 of 33 positions shown · non-contrast
Comparison: None.

CLINICAL DATA: Patient describes a lump within the outer RIGHT
breast. Recent history of pregnancy. This is patient's first
mammogram.

EXAM:
DIGITAL DIAGNOSTIC BILATERAL MAMMOGRAM WITH TOMOSYNTHESIS AND CAD;
ULTRASOUND RIGHT BREAST LIMITED
TECHNIQUE: Bilateral digital diagnostic mammography and breast tomosynthesis
was performed. The images were evaluated with computer-aided
detection.; Targeted ultrasound examination of the right breast was
performed

[R ML (1 of 2)]
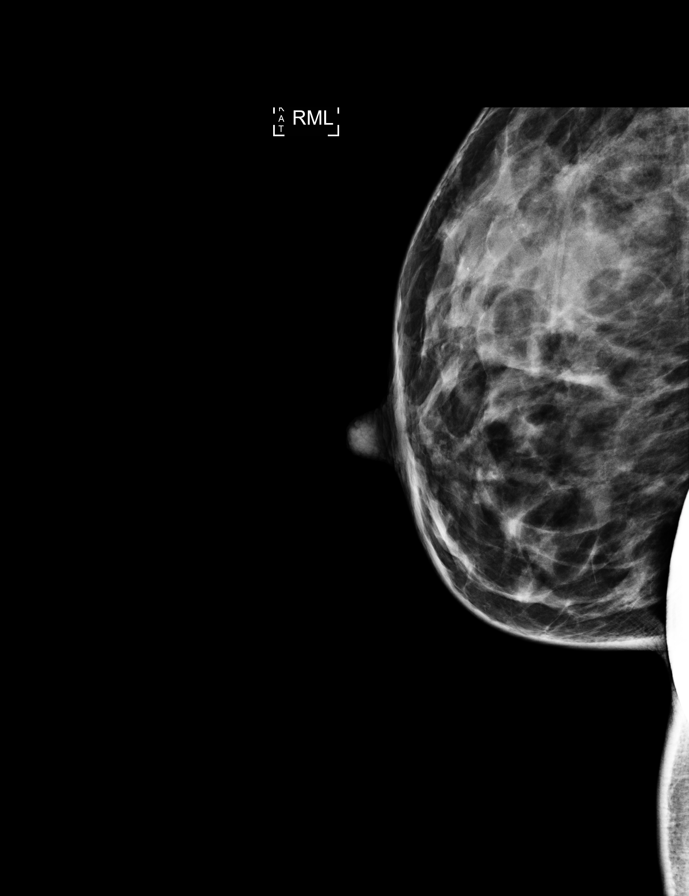

[R ML (2 of 2)]
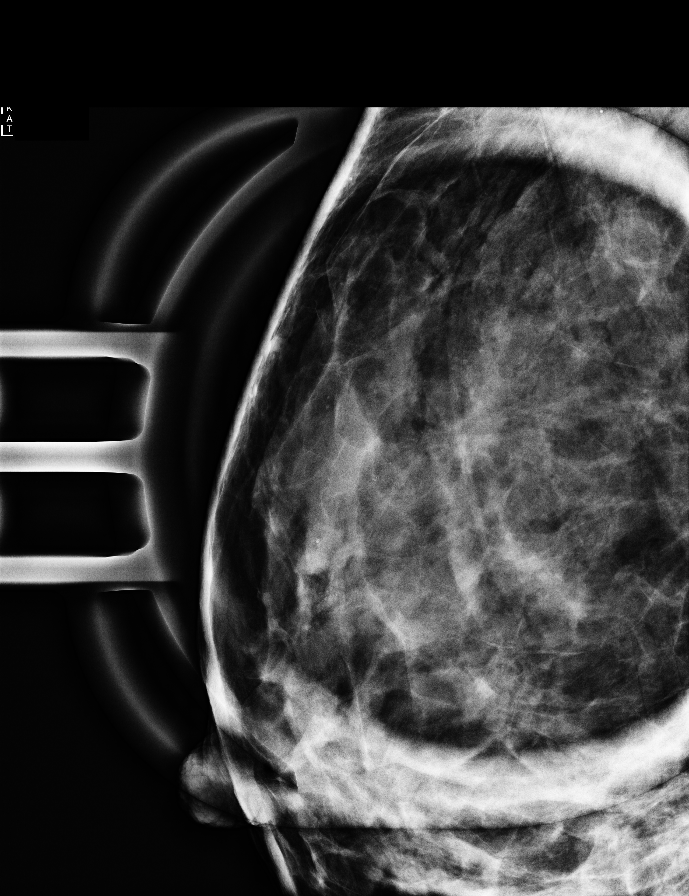

[R CC]
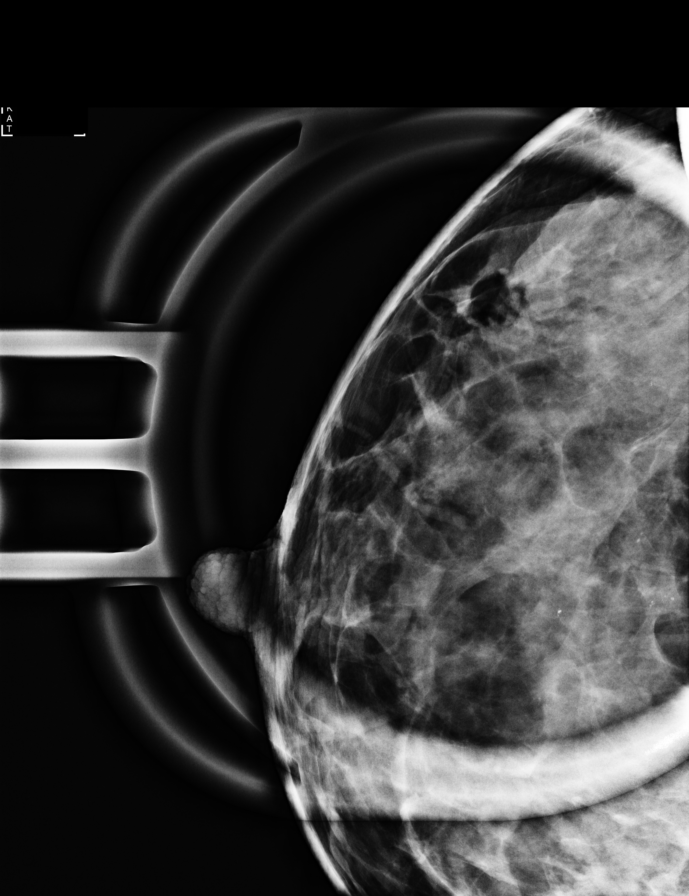

[L MLO synth-2D]
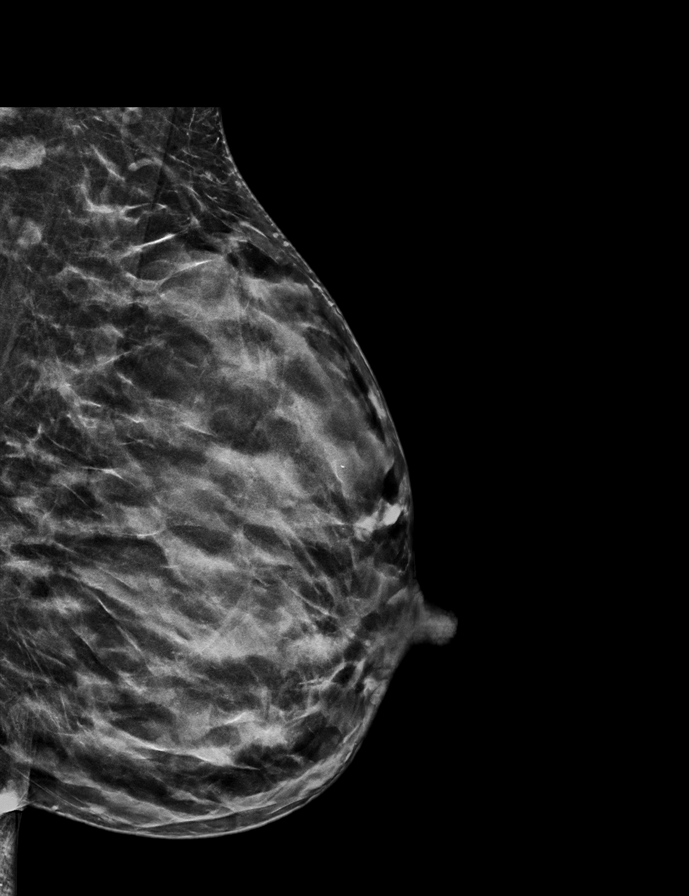

[R MLO synth-2D]
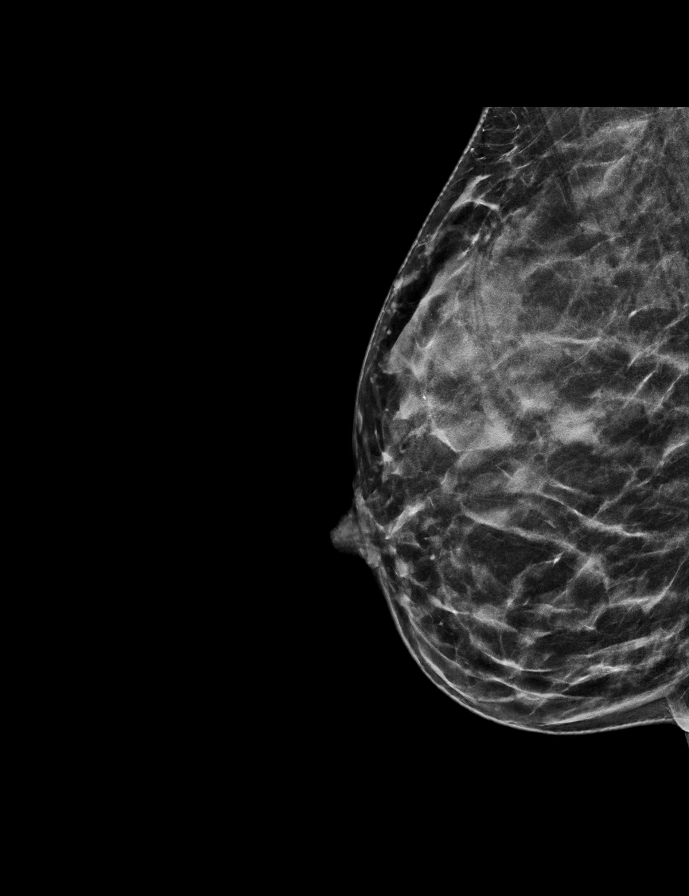

[R TAN synth-2D]
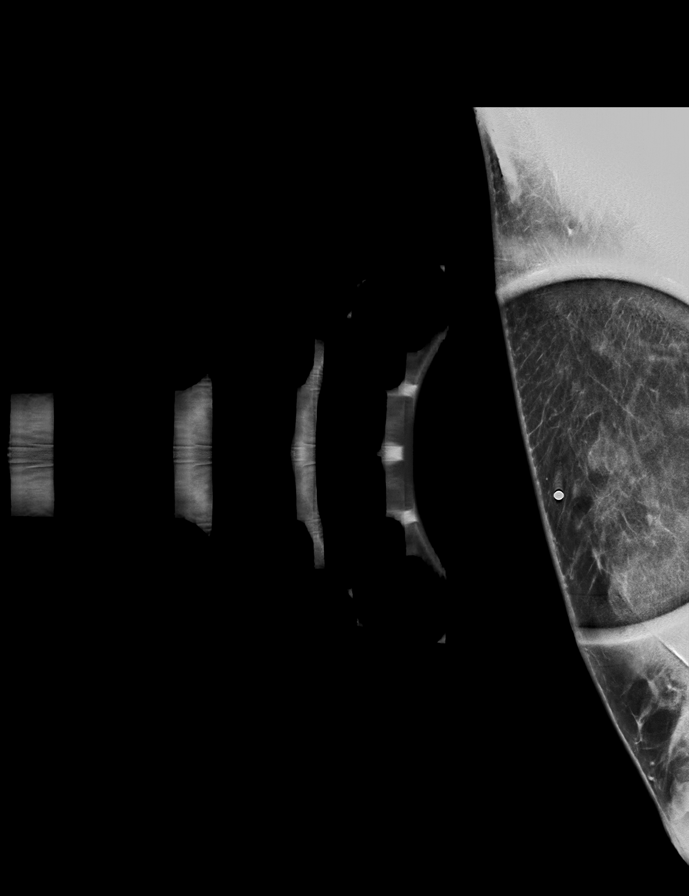

[L CC synth-2D]
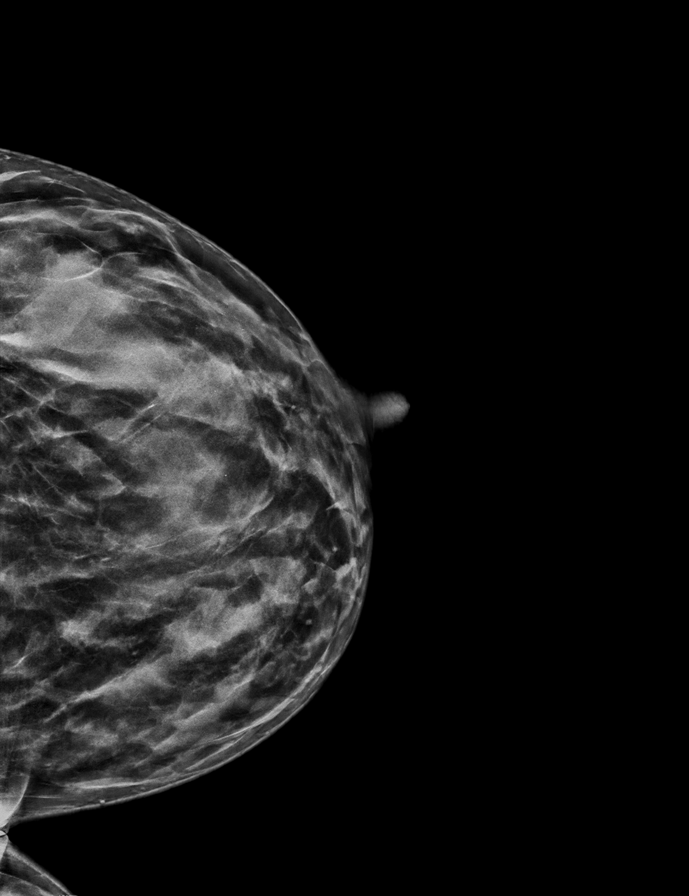

[R CC synth-2D]
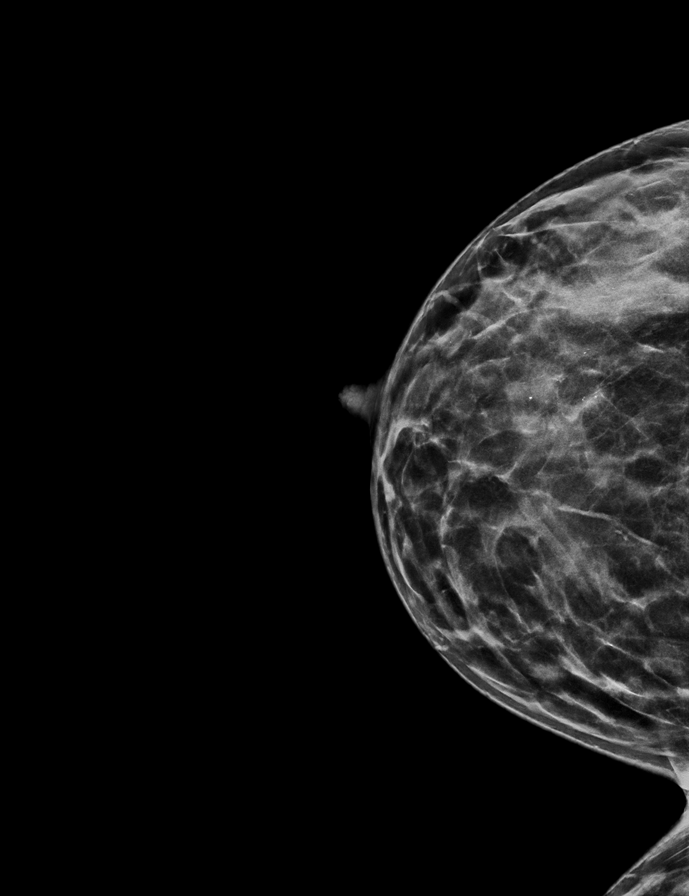

[8 of 33 positions shown; findings below may reference images not displayed]

ACR Breast Density Category c: The breast tissue is heterogeneously
dense, which may obscure small masses.
FINDINGS: There is an oval circumscribed mass within the outer RIGHT breast,
measuring approximately 9 mm, with central lucency suggestive of a
lymph node, corresponding to the palpable area of concern with
overlying skin marker in place.

There are scattered/loosely grouped calcifications within the
upper-outer quadrant of the RIGHT breast, indeterminate by
morphology but suspected benign milk of calcium/fibrocystic change.
No suspicious distribution or fine linear branching calcifications.

No dominant masses, suspicious calcifications or secondary signs of
malignancy elsewhere within the RIGHT breast.

No dominant masses, suspicious calcifications or secondary signs of
malignancy within the LEFT breast.

Targeted ultrasound is performed, evaluating the outer RIGHT breast
with particular attention to the 9 o'clock axis as directed by the
patient, showing a benign morphologically-normal intramammary lymph
node.
IMPRESSION: 1. Benign normal-appearing lymph node within the RIGHT breast at the
9 o'clock axis, 10 cm from the nipple, corresponding to the palpable
area of concern.
2. Scattered/loosely grouped calcifications within the upper-outer
quadrant of the RIGHT breast, most likely benign milk of
calcium/fibrocystic change. Recommend follow-up RIGHT breast
diagnostic mammogram in 6 months to ensure stability.
3. No evidence of malignancy within the LEFT breast.

RECOMMENDATION:
RIGHT breast diagnostic mammogram, with magnification views, in 6
months.

I have discussed the findings and recommendations with the patient.
If applicable, a reminder letter will be sent to the patient
regarding the next appointment.

BI-RADS CATEGORY  3: Probably benign.

## 2023-04-27 ENCOUNTER — Ambulatory Visit: Payer: Medicaid Other | Admitting: Physical Therapy

## 2023-04-29 ENCOUNTER — Other Ambulatory Visit: Payer: Medicaid Other

## 2023-04-29 DIAGNOSIS — Z130 Encounter for screening for diseases of the blood and blood-forming organs and certain disorders involving the immune mechanism: Secondary | ICD-10-CM | POA: Diagnosis not present

## 2023-04-29 DIAGNOSIS — Z3A23 23 weeks gestation of pregnancy: Secondary | ICD-10-CM

## 2023-04-29 DIAGNOSIS — Z3482 Encounter for supervision of other normal pregnancy, second trimester: Secondary | ICD-10-CM

## 2023-04-29 DIAGNOSIS — Z113 Encounter for screening for infections with a predominantly sexual mode of transmission: Secondary | ICD-10-CM | POA: Diagnosis not present

## 2023-04-29 DIAGNOSIS — Z131 Encounter for screening for diabetes mellitus: Secondary | ICD-10-CM

## 2023-04-30 LAB — 28 WEEK RH+PANEL
Basophils Absolute: 0 10*3/uL (ref 0.0–0.2)
Basos: 0 %
EOS (ABSOLUTE): 0.2 10*3/uL (ref 0.0–0.4)
Eos: 3 %
Gestational Diabetes Screen: 113 mg/dL (ref 70–139)
HIV Screen 4th Generation wRfx: NONREACTIVE
Hematocrit: 32.5 % — ABNORMAL LOW (ref 34.0–46.6)
Hemoglobin: 10.7 g/dL — ABNORMAL LOW (ref 11.1–15.9)
Immature Grans (Abs): 0 10*3/uL (ref 0.0–0.1)
Immature Granulocytes: 0 %
Lymphocytes Absolute: 1.5 10*3/uL (ref 0.7–3.1)
Lymphs: 18 %
MCH: 30.4 pg (ref 26.6–33.0)
MCHC: 32.9 g/dL (ref 31.5–35.7)
MCV: 92 fL (ref 79–97)
Monocytes Absolute: 0.5 10*3/uL (ref 0.1–0.9)
Monocytes: 6 %
Neutrophils Absolute: 6.1 10*3/uL (ref 1.4–7.0)
Neutrophils: 73 %
Platelets: 227 10*3/uL (ref 150–450)
RBC: 3.52 x10E6/uL — ABNORMAL LOW (ref 3.77–5.28)
RDW: 12 % (ref 11.7–15.4)
RPR Ser Ql: NONREACTIVE
WBC: 8.4 10*3/uL (ref 3.4–10.8)

## 2023-05-03 ENCOUNTER — Encounter: Payer: Medicaid Other | Admitting: Physical Therapy

## 2023-05-04 ENCOUNTER — Other Ambulatory Visit: Payer: Medicaid Other

## 2023-05-04 ENCOUNTER — Encounter: Payer: Medicaid Other | Admitting: Obstetrics

## 2023-05-10 ENCOUNTER — Ambulatory Visit: Payer: Medicaid Other | Attending: Obstetrics and Gynecology | Admitting: Physical Therapy

## 2023-05-11 ENCOUNTER — Ambulatory Visit (INDEPENDENT_AMBULATORY_CARE_PROVIDER_SITE_OTHER): Payer: Medicaid Other | Admitting: Obstetrics

## 2023-05-11 VITALS — BP 101/56 | HR 78 | Wt 166.0 lb

## 2023-05-11 DIAGNOSIS — D259 Leiomyoma of uterus, unspecified: Secondary | ICD-10-CM

## 2023-05-11 DIAGNOSIS — O09523 Supervision of elderly multigravida, third trimester: Secondary | ICD-10-CM

## 2023-05-11 DIAGNOSIS — Z3A28 28 weeks gestation of pregnancy: Secondary | ICD-10-CM

## 2023-05-11 DIAGNOSIS — O09529 Supervision of elderly multigravida, unspecified trimester: Secondary | ICD-10-CM

## 2023-05-11 DIAGNOSIS — O3413 Maternal care for benign tumor of corpus uteri, third trimester: Secondary | ICD-10-CM

## 2023-05-11 DIAGNOSIS — Z3480 Encounter for supervision of other normal pregnancy, unspecified trimester: Secondary | ICD-10-CM

## 2023-05-11 NOTE — Progress Notes (Signed)
Routine Prenatal Care Visit  Subjective  Tiffany Franco is a 37 y.o. G4P3004 at [redacted]w[redacted]d being seen today for ongoing prenatal care.  She is currently monitored for the following issues for this high-risk pregnancy and has Previous cesarean section; Family history of fraternal twins; History of fourth degree perineal laceration; Depression; Dermatofibroma; Epidermal inclusion cyst; Recurrent UTI; Abscess of lower back; Mass of lower outer quadrant of right breast; Gross hematuria; Anemia; Nontraumatic coccydynia; Stomatitis; Supervision of high-risk pregnancy of elderly multigravida; Uterine fibroid; History of macrosomia in infant in prior pregnancy, currently pregnant; History of VBAC; and Late prenatal care affecting pregnancy in second trimester on their problem list.  ----------------------------------------------------------------------------------- Patient reports no complaints.  She is following her weight gain, due to her hx of a macrosomic baby. Plans on growth scans. She recently broke out with a rash on her buttocks and legs after visiting family in [place]- believes contact with her dog who had been in the woods caused this. Taking Benedryl and topical Triamcinolone. Contractions: Not present. Vag. Bleeding: None.  Movement: Present. Leaking Fluid denies.  ----------------------------------------------------------------------------------- The following portions of the patient's history were reviewed and updated as appropriate: allergies, current medications, past family history, past medical history, past social history, past surgical history and problem list. Problem list updated.  Objective  Blood pressure (!) 101/56, pulse 78, weight 166 lb (75.3 kg), last menstrual period 10/27/2022, currently breastfeeding. Pregravid weight 137 lb (62.1 kg) Total Weight Gain 29 lb (13.2 kg) Urinalysis: Urine Protein    Urine Glucose    Fetal Status:     Movement: Present     General:  Alert, oriented  and cooperative. Patient is in no acute distress.  Skin: Skin is warm and dry. No rash noted.   Cardiovascular: Normal heart rate noted  Respiratory: Normal respiratory effort, no problems with respiration noted  Abdomen: Soft, gravid, appropriate for gestational age. Pain/Pressure: Absent     Pelvic:  Cervical exam deferred        Extremities: Normal range of motion.     Mental Status: Normal mood and affect. Normal behavior. Normal judgment and thought content.   Assessment   37 y.o. [id number] at [redacted]w[redacted]d by  08/03/2023, Date entered prior to episode creation presenting for routine prenatal visit Contact Dermatitis- under treatment Hx of larger baby and 4th degree tear Hx of previous CS  Plan   fourth Problems (from 01/03/23 to present)     Problem Noted Resolved   Uterine fibroid 01/19/2023 by Burney Gauze, CNM No   Overview Signed 01/19/2023  9:09 AM by Burney Gauze, CNM    Noted on dating ultrasound      Supervision of high-risk pregnancy of elderly multigravida 01/03/2023 by Loran Senters, CMA No   Overview Addendum 05/10/2023 10:08 AM by Donnetta Hail, CMA     Clinical Staff Provider  Office Location  [facility] Dating  Not found.  Language  English Anatomy US  Ordered normal anatomy  Flu Vaccine  Declined Genetic Screen  NIPS: ordered, drqawn 9/4  TDaP vaccine   offer Hgb A1C or  GTT Early : Third trimester :   Covid declined   LAB RESULTS   Rhogam     Blood Type     Feeding Plan undecided Antibody    Contraception vasectomy Rubella    Circumcision yes RPR     Pediatrician  [facility]     Support Person Weston Brass HIV    Prenatal Classes no Varicella  GBS  (For PCN allergy, check sensitivities)   BTL Consent  Hep C     VBAC Consent  Pap No results found for: "DIAGPAP"    Hgb Electro      CF      SMA                    Preterm labor symptoms and general obstetric precautions including but not limited to vaginal bleeding, contractions, leaking of fluid  and fetal movement were reviewed in detail with the patient. Please refer to After Visit Summary for other counseling recommendations.  She declined tdap today- discussed reasons why this is recommended. Growth scans at 32 and 35 weeks.She is uncertain re repeat CS or VBAC. Signed BTL papers, but we also discussed IUD option  or vasectomy.  Return in about 2 weeks (around 05/25/2023) for return OB.  Mirna Mires, CNM  05/11/2023 9:05 AM

## 2023-05-11 NOTE — Addendum Note (Signed)
Addended by: Donnetta Hail on: 05/11/2023 09:49 AM   Modules accepted: Orders

## 2023-05-16 ENCOUNTER — Encounter: Payer: Medicaid Other | Admitting: Physical Therapy

## 2023-05-25 ENCOUNTER — Encounter: Payer: Medicaid Other | Admitting: Physical Therapy

## 2023-05-30 ENCOUNTER — Encounter: Payer: Medicaid Other | Admitting: Obstetrics

## 2023-05-30 ENCOUNTER — Telehealth: Payer: Self-pay | Admitting: Obstetrics

## 2023-05-30 NOTE — Telephone Encounter (Signed)
Reached out to pt to reschedule ROB appt that was scheduled on 05/30/2023 at 8:55 with Q[place] Livings.  Was able to reschedule the appt for 05/31/2023 at 10:15 with Q[place]man Livings.

## 2023-05-31 ENCOUNTER — Encounter: Payer: Medicaid Other | Admitting: Obstetrics

## 2023-06-01 ENCOUNTER — Ambulatory Visit (INDEPENDENT_AMBULATORY_CARE_PROVIDER_SITE_OTHER): Payer: Medicaid Other

## 2023-06-01 VITALS — BP 106/67 | HR 80 | Wt 170.0 lb

## 2023-06-01 DIAGNOSIS — O99013 Anemia complicating pregnancy, third trimester: Secondary | ICD-10-CM

## 2023-06-01 DIAGNOSIS — Z98891 History of uterine scar from previous surgery: Secondary | ICD-10-CM

## 2023-06-01 DIAGNOSIS — Z3A31 31 weeks gestation of pregnancy: Secondary | ICD-10-CM

## 2023-06-01 DIAGNOSIS — D649 Anemia, unspecified: Secondary | ICD-10-CM

## 2023-06-01 DIAGNOSIS — O09293 Supervision of pregnancy with other poor reproductive or obstetric history, third trimester: Secondary | ICD-10-CM

## 2023-06-01 DIAGNOSIS — O34219 Maternal care for unspecified type scar from previous cesarean delivery: Secondary | ICD-10-CM

## 2023-06-01 DIAGNOSIS — O09299 Supervision of pregnancy with other poor reproductive or obstetric history, unspecified trimester: Secondary | ICD-10-CM

## 2023-06-01 NOTE — Assessment & Plan Note (Signed)
-   Not taking iron supplement but trying to increase her dietary intake. Iron resources sent via MyChart.

## 2023-06-01 NOTE — Progress Notes (Signed)
    Return Prenatal Note   Assessment/Plan   Plan  37 y.o. [name] at [redacted]w[redacted]d presents for follow-up OB visit. Reviewed prenatal record including previous visit note.  History of macrosomia in infant in prior pregnancy, currently pregnant - Discussed scheduling follow up growth ultrasound.   Anemia - Not taking iron supplement but trying to increase her dietary intake. Iron resources sent via MyChart.   Previous cesarean section - Still undecided on repeat CS vs TOLAC. May lean towards CS if baby is measuring large on follow up growth scan which she plans to schedule today. She strongly desires BTL, especially with CS. Consent signed today. Reviewed need to schedule appointment with MD for delivery planning.   No orders of the defined types were placed in this encounter.  Return in about 2 weeks (around 06/15/2023) for ROB.   Future Appointments  Date Time Provider Department Center  06/15/2023  9:15 AM [facility] [place] [facility] None  06/15/2023 10:35 AM Linzie Collin, MD [facility] None    For next visit:  continue with routine prenatal care     Subjective   37 y.o. [name] at [redacted]w[redacted]d presents for this follow-up prenatal visit.  Patient has no concerns. Patient reports: Movement: Present Contractions: Irritability  Objective   Flow sheet Vitals: Pulse Rate: 80 BP: 106/67 Fundal Height: 31 cm Fetal Heart Rate (bpm): 140 Total weight gain: 33 lb (15 kg)  General Appearance  No acute distress, well appearing, and well nourished Pulmonary   Normal work of breathing Neurologic   Alert and oriented to person, place, and time Psychiatric   Mood and affect within normal limits  [name], CNM  05/31/2409:10 AM

## 2023-06-01 NOTE — Assessment & Plan Note (Signed)
-   Still undecided on repeat CS vs TOLAC. May lean towards CS if baby is measuring large on follow up growth scan which she plans to schedule today. She strongly desires BTL, especially with CS. Consent signed today. Reviewed need to schedule appointment with MD for delivery planning.

## 2023-06-01 NOTE — Assessment & Plan Note (Signed)
-   Discussed scheduling follow up growth ultrasound.

## 2023-06-03 ENCOUNTER — Encounter: Payer: Medicaid Other | Admitting: Physical Therapy

## 2023-06-13 ENCOUNTER — Other Ambulatory Visit: Payer: Self-pay

## 2023-06-13 DIAGNOSIS — O09523 Supervision of elderly multigravida, third trimester: Secondary | ICD-10-CM

## 2023-06-13 DIAGNOSIS — O09293 Supervision of pregnancy with other poor reproductive or obstetric history, third trimester: Secondary | ICD-10-CM

## 2023-06-15 ENCOUNTER — Telehealth: Payer: Self-pay | Admitting: Obstetrics and Gynecology

## 2023-06-15 ENCOUNTER — Ambulatory Visit: Payer: Medicaid Other

## 2023-06-15 ENCOUNTER — Ambulatory Visit (INDEPENDENT_AMBULATORY_CARE_PROVIDER_SITE_OTHER): Payer: Medicaid Other | Admitting: Obstetrics and Gynecology

## 2023-06-15 ENCOUNTER — Encounter: Payer: Self-pay | Admitting: Obstetrics and Gynecology

## 2023-06-15 VITALS — BP 101/67 | HR 68 | Wt 174.5 lb

## 2023-06-15 DIAGNOSIS — O09299 Supervision of pregnancy with other poor reproductive or obstetric history, unspecified trimester: Secondary | ICD-10-CM

## 2023-06-15 DIAGNOSIS — O09293 Supervision of pregnancy with other poor reproductive or obstetric history, third trimester: Secondary | ICD-10-CM

## 2023-06-15 DIAGNOSIS — Z98891 History of uterine scar from previous surgery: Secondary | ICD-10-CM

## 2023-06-15 DIAGNOSIS — O09523 Supervision of elderly multigravida, third trimester: Secondary | ICD-10-CM | POA: Diagnosis not present

## 2023-06-15 DIAGNOSIS — R102 Pelvic and perineal pain: Secondary | ICD-10-CM

## 2023-06-15 DIAGNOSIS — Z3A33 33 weeks gestation of pregnancy: Secondary | ICD-10-CM

## 2023-06-15 DIAGNOSIS — Z3A32 32 weeks gestation of pregnancy: Secondary | ICD-10-CM | POA: Diagnosis not present

## 2023-06-15 DIAGNOSIS — O26893 Other specified pregnancy related conditions, third trimester: Secondary | ICD-10-CM

## 2023-06-15 DIAGNOSIS — O09529 Supervision of elderly multigravida, unspecified trimester: Secondary | ICD-10-CM

## 2023-06-15 LAB — POCT URINALYSIS DIPSTICK OB
Bilirubin, UA: NEGATIVE
Blood, UA: NEGATIVE
Glucose, UA: NEGATIVE
Leukocytes, UA: NEGATIVE
Nitrite, UA: NEGATIVE
POC,PROTEIN,UA: NEGATIVE
Spec Grav, UA: 1.01 (ref 1.010–1.025)
Urobilinogen, UA: 0.2 U/dL
pH, UA: 7 (ref 5.0–8.0)

## 2023-06-15 NOTE — Telephone Encounter (Signed)
Reached out to pt to reschedule growth scan that was scheduled on 06/15/2023 at 9:15 at [facility].  Left message for pt to call back to reschedule.  Pt also has an OB appt at 10:35 on 12/18 with Dr. Logan Bores.

## 2023-06-15 NOTE — Telephone Encounter (Signed)
Pt has rescheduled for 06/15/2023 at 1:30 for [place].

## 2023-06-15 NOTE — Progress Notes (Signed)
ROB. Patient states daily fetal movement with occasional pain and pressure. She is still undecided between repeat cesarean or TOLAC, believes she will be able to make decision depending on her growth ultrasounds, next scan is today. She states no questions or concerns at this time.

## 2023-06-15 NOTE — Progress Notes (Signed)
ROB: 33 weeks to she has previously had a vaginal birth followed by cesarean delivery of twins followed by a vaginal birth of a 10 pound baby.  Her 10 pound baby was a difficult labor and delivery and she is considering a repeat cesarean delivery.  She would like to base that cesarean delivery on the current size of this baby.  She is scheduled for an ultrasound today for growth and likely will need another growth scan at 37 weeks.  She is stated that her baby looks significantly large she will schedule a cesarean delivery with possible tubal ligation.  If the baby is reasonably size I have encouraged her to have a vaginal birth.  I would expect to go well with a proven pelvis as long as the baby is not again 10 pounds.  We have discussed other birth control methods including long-term methods like IUD.  Her husband has also been to his first appointment on the path to vasectomy.

## 2023-06-29 NOTE — L&D Delivery Note (Signed)
Delivery Note   Tiffany Franco is a 38 y.o. [id number] at [redacted]w[redacted]d Estimated Date of Delivery: 08/03/23  PRE-OPERATIVE DIAGNOSIS:  1) [redacted]w[redacted]d pregnancy.   POST-OPERATIVE DIAGNOSIS:  1) [redacted]w[redacted]d pregnancy s/p VBAC, Spontaneous  Delivery Type: VBAC, Spontaneous   Delivery Anesthesia: Epidural  Labor Complications: none    ESTIMATED BLOOD LOSS: 75 ml    FINDINGS:   1) female infant, Apgar scores of    at 1 minute and    at 5 minutes. Birthweight pending.     SPECIMENS:   PLACENTA:   Appearance: Intact   Removal: Spontaneous     Disposition: Per protocol  CORD BLOOD: Collected  DISPOSITION:  Infant left in stable condition in the delivery room, with L&D personnel and mother.  NARRATIVE SUMMARY: Labor course:  Tiffany Franco is a [id number] at [redacted]w[redacted]d who presented to Labor & Delivery for contractions. Her initial cervical exam was 4/50/-3. Labor proceeded with augmentation, and she was found to be completely dilated at 0757. With excellent maternal pushing effort, she birthed a viable female infant at 55. There was not a nuchal cord. The shoulders were birthed without difficulty. The infant was placed skin-to-skin with Tiffany Franco. The cord was doubly clamped and cut when pulsations ceased. The placenta delivered spontaneously and was noted to be intact with a 3VC. A perineal and vaginal examination was performed. Episiotomy/Lacerations: 1st degree;Perineal Lacerations were repaired with Vicryl suture using epidural anesthesia. Tiffany Franco tolerated this well. Mother and baby were left in stable condition.   Guadlupe Spanish, CNM 07/27/2023 8:49 AM

## 2023-06-30 ENCOUNTER — Ambulatory Visit (INDEPENDENT_AMBULATORY_CARE_PROVIDER_SITE_OTHER): Payer: Medicaid Other | Admitting: Obstetrics

## 2023-06-30 ENCOUNTER — Encounter: Payer: Self-pay | Admitting: Obstetrics

## 2023-06-30 VITALS — BP 122/72 | HR 76 | Wt 179.0 lb

## 2023-06-30 DIAGNOSIS — Z98891 History of uterine scar from previous surgery: Secondary | ICD-10-CM

## 2023-06-30 DIAGNOSIS — Z3689 Encounter for other specified antenatal screening: Secondary | ICD-10-CM

## 2023-06-30 DIAGNOSIS — Z3A35 35 weeks gestation of pregnancy: Secondary | ICD-10-CM

## 2023-06-30 DIAGNOSIS — O09529 Supervision of elderly multigravida, unspecified trimester: Secondary | ICD-10-CM

## 2023-06-30 NOTE — Assessment & Plan Note (Addendum)
-  Would like to to schedule CS/BTL but will TOLAC if she labors spontaneously before then. Dr. Logan Bores will schedule

## 2023-06-30 NOTE — Progress Notes (Signed)
    Return Prenatal Note   Assessment/Plan   Plan  38 y.o. [id number] at [redacted]w[redacted]d presents for follow-up OB visit. Reviewed prenatal record including previous visit note.  Previous cesarean section -Would like to to schedule CS/BTL but will TOLAC if she labors spontaneously before then. Dr. Janit will schedule  Supervision of high-risk pregnancy of elderly multigravida -Reviewed comfort measures for [facility] ctx -Discussed GBS and GC/chlamydia swabs at next visit -Growth scan scheduled 07/06/23   No orders of the defined types were placed in this encounter.  Return in about 1 week (around 07/07/2023).   Future Appointments  Date Time Provider Department Center  07/06/2023 10:15 AM [facility] US  1 AOB-IMG None  07/08/2023  8:15 AM Sebastian Sham, CNM [facility]OB-[facility] None  07/13/2023  9:35 AM Connell Davies, MD [facility] None    For next visit:  ROB with GBS and GC/chlamydia    Subjective   Tiffany Franco is feeling a lot of pressure and frequent BH ctx. She would like to discuss her birth options.   Movement: Present Contractions: Not present  Objective   Flow sheet Vitals: Pulse Rate: 76 BP: 122/72 Total weight gain: 42 lb (19.1 kg)  General Appearance  No acute distress, well appearing, and well nourished Pulmonary   Normal work of breathing Neurologic   Alert and oriented to person, place, and time Psychiatric   Mood and affect within normal limits  Eleanor Canny, CNM 06/30/23 11:04 AM

## 2023-06-30 NOTE — Assessment & Plan Note (Addendum)
-  Reviewed comfort measures for BH ctx -Discussed GBS and GC/chlamydia swabs at next visit -Growth scan scheduled 07/06/23

## 2023-07-03 ENCOUNTER — Encounter: Payer: Self-pay | Admitting: Certified Registered Nurse Anesthetist

## 2023-07-03 ENCOUNTER — Encounter: Payer: Self-pay | Admitting: Urgent Care

## 2023-07-05 ENCOUNTER — Encounter: Payer: Medicaid Other | Admitting: Physical Therapy

## 2023-07-06 ENCOUNTER — Other Ambulatory Visit: Payer: Medicaid Other

## 2023-07-08 ENCOUNTER — Encounter: Payer: Medicaid Other | Admitting: Certified Nurse Midwife

## 2023-07-11 ENCOUNTER — Other Ambulatory Visit: Payer: Self-pay | Admitting: Obstetrics and Gynecology

## 2023-07-11 DIAGNOSIS — O34219 Maternal care for unspecified type scar from previous cesarean delivery: Secondary | ICD-10-CM

## 2023-07-11 DIAGNOSIS — O3663X0 Maternal care for excessive fetal growth, third trimester, not applicable or unspecified: Secondary | ICD-10-CM

## 2023-07-11 DIAGNOSIS — O09293 Supervision of pregnancy with other poor reproductive or obstetric history, third trimester: Secondary | ICD-10-CM

## 2023-07-11 DIAGNOSIS — O09523 Supervision of elderly multigravida, third trimester: Secondary | ICD-10-CM

## 2023-07-11 NOTE — Telephone Encounter (Signed)
 I contacted the patient via phone, I asked the patient if she needed to be seen sooner than appointment scheduled for 1/15. The patient states she is okay and everything is good. I apologizes for the delay in getting back to the patient. She express  It's okay. The patient has confirmed appointments for 1/15 already scheduled.

## 2023-07-11 NOTE — Telephone Encounter (Signed)
 Did pt get scheduled? I think I had already gone for the day when this was sent to me.

## 2023-07-12 ENCOUNTER — Encounter: Payer: Medicaid Other | Admitting: Physical Therapy

## 2023-07-13 ENCOUNTER — Other Ambulatory Visit (HOSPITAL_COMMUNITY)
Admission: RE | Admit: 2023-07-13 | Discharge: 2023-07-13 | Disposition: A | Discharge status: Home / Self Care | Payer: Medicaid Other | Source: Ambulatory Visit | Attending: Obstetrics and Gynecology | Admitting: Obstetrics and Gynecology

## 2023-07-13 ENCOUNTER — Ambulatory Visit (INDEPENDENT_AMBULATORY_CARE_PROVIDER_SITE_OTHER): Payer: Medicaid Other

## 2023-07-13 ENCOUNTER — Ambulatory Visit (INDEPENDENT_AMBULATORY_CARE_PROVIDER_SITE_OTHER): Payer: Medicaid Other | Admitting: Obstetrics and Gynecology

## 2023-07-13 ENCOUNTER — Encounter: Payer: Self-pay | Admitting: Obstetrics and Gynecology

## 2023-07-13 VITALS — BP 124/73 | HR 87 | Wt 183.6 lb

## 2023-07-13 DIAGNOSIS — Z8759 Personal history of other complications of pregnancy, childbirth and the puerperium: Secondary | ICD-10-CM

## 2023-07-13 DIAGNOSIS — O09529 Supervision of elderly multigravida, unspecified trimester: Secondary | ICD-10-CM | POA: Diagnosis not present

## 2023-07-13 DIAGNOSIS — O09293 Supervision of pregnancy with other poor reproductive or obstetric history, third trimester: Secondary | ICD-10-CM

## 2023-07-13 DIAGNOSIS — Z113 Encounter for screening for infections with a predominantly sexual mode of transmission: Secondary | ICD-10-CM | POA: Insufficient documentation

## 2023-07-13 DIAGNOSIS — O34219 Maternal care for unspecified type scar from previous cesarean delivery: Secondary | ICD-10-CM | POA: Diagnosis not present

## 2023-07-13 DIAGNOSIS — Z3A36 36 weeks gestation of pregnancy: Secondary | ICD-10-CM

## 2023-07-13 DIAGNOSIS — O09523 Supervision of elderly multigravida, third trimester: Secondary | ICD-10-CM | POA: Diagnosis not present

## 2023-07-13 DIAGNOSIS — Z3A37 37 weeks gestation of pregnancy: Secondary | ICD-10-CM

## 2023-07-13 DIAGNOSIS — O3663X0 Maternal care for excessive fetal growth, third trimester, not applicable or unspecified: Secondary | ICD-10-CM | POA: Diagnosis not present

## 2023-07-13 DIAGNOSIS — Z98891 History of uterine scar from previous surgery: Secondary | ICD-10-CM

## 2023-07-13 DIAGNOSIS — Z3685 Encounter for antenatal screening for Streptococcus B: Secondary | ICD-10-CM | POA: Diagnosis not present

## 2023-07-13 DIAGNOSIS — O09299 Supervision of pregnancy with other poor reproductive or obstetric history, unspecified trimester: Secondary | ICD-10-CM

## 2023-07-13 NOTE — Patient Instructions (Signed)
 Natural methods to encourage timely labor   Your EDD (estimated due date) is just that... an estimate.  Your baby may come earlier or later, there are a lot of hormones and feedback loops to cause labor to occur and to be honest, no one actually knows the EXACT reason why this occurs.     Full term is birth after 37 weeks, while post-term is birth after 41 weeks.  We will guide you in this process of determining labor and delivery planning, but we hope that you will go into spontaneous labor between these 4 weeks.  Review the information in your Midwifery handout for our induction of labor detail, statistics, and processes as when your pregnancy ages, so does your placenta and this can lead to poorer placental.    The best natural method to consider when encouraging labor is time and patience.  This information below is to discuss ways that your body can prepare and be ready for labor and encourage this to happen on its own without a medical induction at the hospital.  Many methods do not have a ton of evidence based research behind them, but do come with minimal risk and potential good benefits  of helping your body prepare for labor. The information on herbs has been gathered over hundreds of years and has little scientific research, but has been tested by time and experience.       At 36 weeks: EVENING PRIMROSE OIL: -- These are available in capsules (500-1000mg ) at whole foods, [employer], [place], etc.  -- May take 1-3 capsules a day.  The capsules can be taken orally and swallowed, or inserted vaginally at night.  They will leak out overnight, so wear a pad or a liner to avoid oil on your sheets.  -- May take one capsule orally and insert one vaginally at night until delivery.  May increase to 2 vaginally at night from 38 weeks to delivery.  -- These contain GLA (an Omega-6 fatty acid) and are a precursor to labor hormones and have been shown to get the body ready for labor.  They may soften  the cervix, but will not cause you to be induced, so take without worry.   -- If you have had a vaginal delivery in the past, likely your cervix is already ripe and you do not need to take these. Discuss with your midwife.    DATES: -- Eat 6 dates a day from 36 weeks until delivery. If you have gestational diabetes, please discuss this with us , as you should probably not use this with the high sugar content in dates.   -- One study showed a reduced need for inducing and augmenting labor and a more favorable delivery outcome, but this was not statistically significant in the study.    RED RASPBERRY LEAF TEA -- Drink 1-2 cups a day until labor begins to help tone the uterus and organize contractions -- Make a strong tea using 1/4 oz dried herb to 1 pint of water and steep for 20 minutes (or use some that is commercially available).  -- Try to do this early in the day as contractions overnight can disrupt a good nights sleep!   ACUPUNCTURE -- can be done weekly starting at 36 weeks by a trained acupuncturist.  There are many in the area that perform this using fine needles at certain points on the body to help ripen the cervix and induce labor.  -- if this is performed close to the  due date, it has been shown to have a success rate of up to 88% in starting labor.    At 37 weeks:  EVENING PRIMROSE OIL --Take 1000 mg  twice daily - by mouth in the morning and vaginally at bedtime. (If previous C-section, take both doses by mouth)   At 38-39 weeks:  BLUE COHOSH 1 capsule daily starting at [redacted] weeks gestation 2 capsules daily starting at [redacted] weeks gestation 3 capsules daily starting at [redacted] weeks gestation   OTHER SUGGESTIONS: SPINNING BABIES and the MILES CIRCUIT (to encourage optimal positioning)  www.spinningbabies.com & https://glass.com/.com    BELLY BINDING --Poor abdominal support can lead to malpositioned babies and therefore prodromal labor.  Support good abdominal tone and relieve back  pain by wearing a tummy support whether with a belly binder, using a sheet to wrap around your hips, or a rebozo.  It will lift up your baby and place them more centrally in your pelvis to encourage pressure on your cervix.     EXERCISE -- Walking, belly dancing, swimming, bouncing on the birthing ball and some yoga movements can help the baby to descend into the birth canal and apply pressure on the cervix. Talk to us  about which position may be most effective for you and your babys current position.     FOODS -- pineapple: rich in bromelain, an enzyme that can encourage the ripening of the cervix and simulate prostaglandin production             * eat this fresh daily, as canning and juicing destroys the bromelain -- black licorice candy (real): also known to stimulate prostaglandin production due to glycyrrhizin but can also cause mild diarrhea that may lead to sympthatetic uterine contractions, not much research here on this.    HERBS -- Basil, oregano, and thyme can help to coordinate your contractions if they are irregular and help to soften your cervix.  Make an eggplant parmesan or similar that uses these herbs or make a tea with one teaspoon of each and steep for 5 minutes.  Strain the leaves, add honey to sweeten it and sip the tea to welcome labor.    MASSAGE -- Prenatal massage increases blood circulation which can increase the circulation to the placenta and therefore the baby.  It also stimulates the lymph system to increase immunity and release toxins.  Massage can also increase endorphins into the mothers body to prepare her for an easier delivery with the sedating effects on the nervous system and promoting stress relief and relaxation.    RELAXATION/VISUALIZATION --The psyche is one of the 5 P's of labor (others are the passenger, passage, powers, and placenta). Fear and anxiety can stimmulate catecholamine release which can counteract the flow of hormones necessary in promoting  labor.  -- Choose your birth team and environment carefully so that you can feel relaxed and comfortable in the place of labor and birth.  Everyone should believe in you and your power to birth your baby.   -- Some choose to bring lights, battery candles, birth cards (that have photos or phrases of encouragement), essential oil diffusers, etc. to the labor room to help them stay relaxed in a new environment.             OTHER MEDICATIONS FOR CERVICAL RIPENING

## 2023-07-13 NOTE — Progress Notes (Signed)
 ROB: Patient is a 38 y.o. [id number] at [redacted]w[redacted]d who presents for routine OB care.  Pregnancy is complicated by:  h/o Previous cesarean section; Family history of fraternal twins; History of fourth degree perineal laceration; Depression; Dermatofibroma; Epidermal inclusion cyst; Recurrent UTI; Abscess of lower back; Mass of lower outer quadrant of right breast; Gross hematuria; Anemia; Nontraumatic coccydynia; Stomatitis; Supervision of high-risk pregnancy of elderly multigravida; Uterine fibroid; History of macrosomia in infant in prior pregnancy, currently pregnant; History of VBAC; and Late prenatal care affecting pregnancy in second trimester on their problem list..   Patient denies major complaints. Continued discussion had with patient regarding mode of delivery.  Has been counseled in the past, thinks she would like to [facility] but has also gone ahead and scheduled a repeat C-section if labor does not occur (at 39 weeks).  Patient states that she is torn, in her heart truly desires to [facility] again but was traumatized with birth of her last baby due to macrosomia and 4th degree laceration. However notes that she does not desire to deal with the recovery after a C-section as she has 4 other children at home and would like to be able to recover quicker and knows that a vaginal delivery will allow this. Has questions if she can just be induced at 39 weeks. Advised that at this time she has no indicators. Although today's growth scan noting growth at 81%ile [facility] 97th%), is not considered macrosomic at this time. Also discussed risk of IOL for a TOLAC vs just augmentation if needed after spontaneous labor. Offered option of pushing C-section out to due date and consideration of IOL on her due date if she has not labored. Cervix somewhat favorable today, noted funneling with external os 3 cm but internal os 1 cm.  Advised on Spinning Babies techniques, has also started eating dates this week. Given more comprehensive list of  natural labor induction methods and techniques.  Patient notes she will try all of these things.  Third trimester cultures performed today.  RTC in 1 week. Can continue discussion then. Given labor precautions.

## 2023-07-13 NOTE — Progress Notes (Signed)
 ROB [redacted]w[redacted]d:  She is doing well. She reports good fetal movement and has no new concerns today.

## 2023-07-14 LAB — CERVICOVAGINAL ANCILLARY ONLY
Chlamydia: NEGATIVE
Comment: NEGATIVE
Comment: NORMAL
Neisseria Gonorrhea: NEGATIVE

## 2023-07-15 LAB — STREP GP B NAA: Strep Gp B NAA: NEGATIVE

## 2023-07-19 ENCOUNTER — Encounter: Payer: Medicaid Other | Admitting: Physical Therapy

## 2023-07-20 ENCOUNTER — Ambulatory Visit (INDEPENDENT_AMBULATORY_CARE_PROVIDER_SITE_OTHER): Payer: Medicaid Other | Admitting: Obstetrics and Gynecology

## 2023-07-20 ENCOUNTER — Encounter: Payer: Self-pay | Admitting: Obstetrics and Gynecology

## 2023-07-20 VITALS — BP 103/57 | HR 90 | Wt 184.0 lb

## 2023-07-20 DIAGNOSIS — O09529 Supervision of elderly multigravida, unspecified trimester: Secondary | ICD-10-CM

## 2023-07-20 DIAGNOSIS — Z8759 Personal history of other complications of pregnancy, childbirth and the puerperium: Secondary | ICD-10-CM

## 2023-07-20 DIAGNOSIS — Z3A38 38 weeks gestation of pregnancy: Secondary | ICD-10-CM

## 2023-07-20 DIAGNOSIS — Z98891 History of uterine scar from previous surgery: Secondary | ICD-10-CM

## 2023-07-20 LAB — POCT URINALYSIS DIPSTICK OB
Bilirubin, UA: NEGATIVE
Blood, UA: NEGATIVE
Glucose, UA: NEGATIVE
Ketones, UA: NEGATIVE
Leukocytes, UA: NEGATIVE
Nitrite, UA: NEGATIVE
POC,PROTEIN,UA: NEGATIVE
Spec Grav, UA: 1.025 (ref 1.010–1.025)
Urobilinogen, UA: 0.2 U/dL
pH, UA: 6 (ref 5.0–8.0)

## 2023-07-20 NOTE — Progress Notes (Signed)
ROB [redacted]w[redacted]d: She is doing well. She reports good fetal movement. She has been having some cramping.

## 2023-07-20 NOTE — Progress Notes (Signed)
ROB: Patient is a 38 y.o. [id number] at [redacted]w[redacted]d who presents for routine OB care.  Pregnancy is complicated by:  h/o Previous cesarean section; Family history of fraternal twins; History of fourth degree perineal laceration; Depression; Dermatofibroma; Epidermal inclusion cyst; Recurrent UTI; Abscess of lower back; Mass of lower outer quadrant of right breast; Gross hematuria; Anemia; Nontraumatic coccydynia; Stomatitis; Supervision of high-risk pregnancy of elderly multigravida; Uterine fibroid; History of macrosomia in infant in prior pregnancy, currently pregnant; History of VBAC; and Late prenatal care affecting pregnancy in second trimester on their problem list..   Patient denies major complaints. Notes she has been doing all the recommended things to help with cervical ripening and promote labor. Has felt Deberah Pelton but nothing painful.  Cervical exam somewhat improved since last visit. Offered initial cervical sweep today as patient would like to try to go into labor prior to scheduled C/S date next week. Patient ok to perform. Reviewed labor precautions. RTC in 1 week.

## 2023-07-21 ENCOUNTER — Telehealth: Payer: Self-pay

## 2023-07-21 NOTE — Telephone Encounter (Signed)
Patient states she was seen yesterday and had a membrane sweep. She had a good amount of contractions last night. This morning she noticed blood when she wiped and a lot of discharge. Advised normal after membrane sweep. Increased discharge sounds like mucus plug. Patient advised she had not lost it prior. Reiterated when to report to L&D. Patient voices understanding.

## 2023-07-24 ENCOUNTER — Encounter: Payer: Self-pay | Admitting: Obstetrics and Gynecology

## 2023-07-25 ENCOUNTER — Inpatient Hospital Stay
Admission: RE | Admit: 2023-07-25 | Discharge: 2023-07-25 | Disposition: A | Discharge status: Home / Self Care | Payer: Medicaid Other | Source: Ambulatory Visit

## 2023-07-25 NOTE — Progress Notes (Signed)
Call to do pre-op interview for upcoming C-section; patient stated that she wanted to cancel the C-section and was going to try to have the baby naturally. She said she has OB doctor appt tomorrow morning and was going to tell the doctor tomorrow. Pre-admission testing appointment cancelled.

## 2023-07-26 ENCOUNTER — Encounter: Payer: Medicaid Other | Admitting: Physical Therapy

## 2023-07-26 ENCOUNTER — Inpatient Hospital Stay
Admission: EM | Admit: 2023-07-26 | Discharge: 2023-07-28 | DRG: 807 | Disposition: A | Discharge status: Home / Self Care | Payer: Medicaid Other | Attending: Certified Nurse Midwife | Admitting: Certified Nurse Midwife

## 2023-07-26 ENCOUNTER — Ambulatory Visit (INDEPENDENT_AMBULATORY_CARE_PROVIDER_SITE_OTHER): Payer: Medicaid Other | Admitting: Certified Nurse Midwife

## 2023-07-26 ENCOUNTER — Encounter: Payer: Self-pay | Admitting: Obstetrics and Gynecology

## 2023-07-26 ENCOUNTER — Other Ambulatory Visit: Payer: Self-pay | Admitting: Certified Nurse Midwife

## 2023-07-26 ENCOUNTER — Other Ambulatory Visit: Payer: Self-pay

## 2023-07-26 ENCOUNTER — Encounter: Payer: Self-pay | Admitting: Certified Nurse Midwife

## 2023-07-26 VITALS — BP 128/85 | HR 81 | Wt 182.7 lb

## 2023-07-26 DIAGNOSIS — Z87440 Personal history of urinary (tract) infections: Secondary | ICD-10-CM

## 2023-07-26 DIAGNOSIS — O34219 Maternal care for unspecified type scar from previous cesarean delivery: Secondary | ICD-10-CM | POA: Diagnosis not present

## 2023-07-26 DIAGNOSIS — Z833 Family history of diabetes mellitus: Secondary | ICD-10-CM | POA: Diagnosis not present

## 2023-07-26 DIAGNOSIS — O26893 Other specified pregnancy related conditions, third trimester: Secondary | ICD-10-CM | POA: Diagnosis not present

## 2023-07-26 DIAGNOSIS — Z3A38 38 weeks gestation of pregnancy: Secondary | ICD-10-CM

## 2023-07-26 DIAGNOSIS — K219 Gastro-esophageal reflux disease without esophagitis: Secondary | ICD-10-CM | POA: Diagnosis present

## 2023-07-26 DIAGNOSIS — O9962 Diseases of the digestive system complicating childbirth: Secondary | ICD-10-CM | POA: Diagnosis not present

## 2023-07-26 DIAGNOSIS — Z3A39 39 weeks gestation of pregnancy: Secondary | ICD-10-CM | POA: Diagnosis not present

## 2023-07-26 DIAGNOSIS — O09529 Supervision of elderly multigravida, unspecified trimester: Principal | ICD-10-CM

## 2023-07-26 LAB — CBC
HCT: 32.2 % — ABNORMAL LOW (ref 36.0–46.0)
Hemoglobin: 11.2 g/dL — ABNORMAL LOW (ref 12.0–15.0)
MCH: 31.5 pg (ref 26.0–34.0)
MCHC: 34.8 g/dL (ref 30.0–36.0)
MCV: 90.7 fL (ref 80.0–100.0)
Platelets: 236 10*3/uL (ref 150–400)
RBC: 3.55 MIL/uL — ABNORMAL LOW (ref 3.87–5.11)
RDW: 16 % — ABNORMAL HIGH (ref 11.5–15.5)
WBC: 11.6 10*3/uL — ABNORMAL HIGH (ref 4.0–10.5)
nRBC: 0 % (ref 0.0–0.2)

## 2023-07-26 MED ORDER — LACTATED RINGERS IV SOLN: INTRAVENOUS | Status: DC

## 2023-07-26 MED ORDER — OXYTOCIN BOLUS FROM INFUSION
333.0000 mL | Freq: Once | INTRAVENOUS | Status: AC
  Administered 2023-07-27: 333 mL via INTRAVENOUS

## 2023-07-26 MED ORDER — LIDOCAINE HCL (PF) 1 % IJ SOLN: 30.0000 mL | INTRAMUSCULAR | Status: DC | PRN

## 2023-07-26 MED ORDER — ACETAMINOPHEN 500 MG PO TABS
1000.0000 mg | ORAL_TABLET | Freq: Four times a day (QID) | ORAL | Status: DC | PRN
  Administered 2023-07-26: 1000 mg via ORAL
  Filled 2023-07-26: qty 2

## 2023-07-26 MED ORDER — LACTATED RINGERS IV SOLN
500.0000 mL | INTRAVENOUS | Status: DC | PRN
Start: 2023-07-26 — End: 2023-07-27
  Administered 2023-07-27: 500 mL via INTRAVENOUS
  Administered 2023-07-27: 1000 mL via INTRAVENOUS

## 2023-07-26 MED ORDER — ONDANSETRON HCL 4 MG/2ML IJ SOLN
4.0000 mg | Freq: Four times a day (QID) | INTRAMUSCULAR | Status: DC | PRN
  Administered 2023-07-27: 4 mg via INTRAVENOUS
  Filled 2023-07-26: qty 2

## 2023-07-26 MED ORDER — HYDROXYZINE HCL 25 MG PO TABS: 50.0000 mg | ORAL_TABLET | Freq: Four times a day (QID) | ORAL | Status: DC | PRN

## 2023-07-26 MED ORDER — FENTANYL CITRATE (PF) 100 MCG/2ML IJ SOLN: 50.0000 ug | INTRAMUSCULAR | Status: DC | PRN

## 2023-07-26 MED ORDER — SOD CITRATE-CITRIC ACID 500-334 MG/5ML PO SOLN: 30.0000 mL | ORAL | Status: DC | PRN

## 2023-07-26 MED ORDER — COCONUT OIL OIL: 1.0000 | TOPICAL_OIL | Status: DC | PRN

## 2023-07-26 MED ORDER — OXYTOCIN-SODIUM CHLORIDE 30-0.9 UT/500ML-% IV SOLN
2.5000 [IU]/h | INTRAVENOUS | Status: DC
  Filled 2023-07-26: qty 500

## 2023-07-26 MED ORDER — CALCIUM CARBONATE ANTACID 500 MG PO CHEW: 2.0000 | CHEWABLE_TABLET | ORAL | Status: DC | PRN

## 2023-07-26 NOTE — Progress Notes (Signed)
Consulted with Dr.Roby and Dr. Clayburn Pert for induction they are in agreement. Pt c/section canceled. Induction scheduled for 0800 1/31. Discussed BTL, pt state since she is  not have c/section her husband is going to have a vasectomy. He has already scheduled this procedure.   Tiffany Franco, CNM

## 2023-07-26 NOTE — H&P (Signed)
[facility] Labor & Delivery  History and Physical  ASSESSMENT AND PLAN   Tiffany Franco is a 38 y.o. [id number] at [redacted]w[redacted]d with EDD: 08/03/2023, by Last Menstrual Period admitted for TOLAC.  1. Admit to L&D  2. EFM: Continuous -- Category 1 3. Pharmacologic pain relief if desired.   4. Admission labs  5. Anticipate NSVD 6. Dr. Valentino Saxon and anesthesia notified of admission  Fetal Status: - cephalic presentation by SVE, Leopold's, and [place] 07/13/23 - EFW: 8-8.5 lbs by Leopold's - FHT currently category 1  Labs/Immunizations: Prenatal labs and studies: ABO, Rh: O/Positive/-- (09/04 1120) Antibody: Negative (09/04 1120) Rubella: 1.48 (09/04 1120) Varicella: 641 (09/04 1120)  RPR: Non Reactive (11/01 0915)  HBsAg: Negative (09/04 1120)  HepC: Non Reactive (09/04 1120) HIV: Non Reactive (11/01 0915)  GBS: Negative/-- (01/15 1113)   TDAP: no Flu:  no RSV: no  Postpartum Plan: - Feeding: Breast Milk - Contraception: plans  vasectomy - Prenatal Care Provider: AOB     HPI   Chief Complaint: contractions  Tiffany Franco is a 38 y.o. [id number] at [redacted]w[redacted]d who presents for contractions that have gotten stronger since this afternoon. She had an exam in the office today and was 3 cm. She has changed her cervix in triage from 4 to 5 cm. She denies LOF. She is having bloody show. She endorses good fetal movement.    She has a history of a vaginal birth, a cesarean birth of twins, and a VBAC.   Pregnancy Complications Patient Active Problem List   Diagnosis Date Noted   History of VBAC 04/07/2023   Late prenatal care affecting pregnancy in second trimester 04/07/2023   History of macrosomia in infant in prior pregnancy, currently pregnant 03/19/2023   Uterine fibroid 01/19/2023   Supervision of high-risk pregnancy of elderly multigravida 01/03/2023   Nontraumatic coccydynia 04/29/2022   Stomatitis 04/29/2022   Gross hematuria 11/04/2021   Anemia 11/04/2021    Abscess of lower back 10/01/2021   Mass of lower outer quadrant of right breast 10/01/2021   Previous cesarean section 02/27/2020   Family history of fraternal twins 02/27/2020   History of fourth degree perineal laceration 02/27/2020   Dermatofibroma 08/24/2017   Epidermal inclusion cyst 08/24/2017   Recurrent UTI 08/24/2017   Depression 03/10/2014    Review of Systems A twelve point review of systems was negative except as stated in HPI.   HISTORY   Medications Medications Prior to Admission  Medication Sig Dispense Refill Last Dose/Taking   Prenatal Vit-Fe Fumarate-FA (MULTIVITAMIN-PRENATAL) 27-0.8 MG TABS tablet Take 1 tablet by mouth daily at 12 noon.   07/25/2023   pseudoephedrine (SUDAFED) 120 MG 12 hr tablet Take 120 mg by mouth 2 (two) times daily.   Taking    Allergies has no known allergies.   OB History OB History  Gravida Para Term Preterm AB Living  4 3 3  0 0 4  SAB IAB Ectopic Multiple Live Births  0 0 0 1 4    # Outcome Date GA Lbr Len/2nd Weight Sex Type Anes PTL Lv  4 Current           3 Term 08/23/20 [redacted]w[redacted]d / 00:37 4530 g F Vag-Spont EPI  LIV     Name: Tiffany Franco,Tiffany Franco     Apgar1: 8  Apgar5: 9  2A Term 05/11/16   3232 g F CS-LTranv   LIV  2B Term 05/11/16   3402 g M CS-LTranv   LIV  1 Term  03/03/10   3402 g F Vag-Spont   LIV    Past Medical History Past Medical History:  Diagnosis Date   Anxiety    Encounter for supervision of high-risk pregnancy with multigravida of advanced maternal age 11/27/2019   Recurrent UTI     Past Surgical History Past Surgical History:  Procedure Laterality Date   CESAREAN SECTION      Social History  reports that she has never smoked. She has never used smokeless tobacco. She reports that she does not drink alcohol and does not use drugs.   Family History family history includes Breast cancer in her maternal grandmother and paternal grandmother; Diabetes in her maternal grandmother; Healthy in her brother,  brother, brother, brother, father, maternal grandfather, mother, paternal grandfather, sister, sister, sister, and sister.   PHYSICAL EXAM   Vitals:   07/26/23 1635  BP: 124/80  Pulse: (!) 102  Resp: 16    Constitutional: No acute distress, well appearing, and well nourished. Neurologic: She is alert and conversational.  Psychiatric: She has a normal mood and affect.  Musculoskeletal: Normal gait, grossly normal range of motion Cardiovascular: Normal rate.   Pulmonary/Chest: Normal work of breathing.  Gastrointestinal/Abdominal: Soft. Gravid. There is no tenderness.  Skin: Skin is warm and dry. No rash noted.  Genitourinary: Normal external female genitalia.  SVE:   Dilation: 5 Effacement (%): 60 Station: -2 Presentation: Vertex Exam by:: Quitman Livings, CNM   NST Interpretation  Baseline: 135 bpm Variability: moderate Accelerations: present Decelerations: none Contractions: q 3-6 minutes Impression: reactive    Glenetta Borg, CNM

## 2023-07-26 NOTE — Progress Notes (Signed)
ROB doing well, feeling good movement. She does not really want to have a cesarean section and is considering canceling her scheduled on for this Friday. She would like to have a vaginal birth. She is requesting to have an induction on Friday . Will discuss with MD. SVE per pt request 3/60/-3 , soft.    Doreene Burke, CNM

## 2023-07-26 NOTE — OB Triage Note (Signed)
Pt is a 37yo G4P4, 38w 6d. She arrived to the unit with complaints of contractions every that are increasing in intensity. She states that the contractions started shortly after her cervical exam in the office today. She denies vaginal bleeding and reports positive fetal movement. VS stable, monitors applied and assessing.   Initial FHT 145 at 1631.

## 2023-07-26 NOTE — Progress Notes (Signed)
Labor Progress Note   ASSESSMENT/PLAN   Tiffany Franco 38 y.o.   [id number]  at [redacted]w[redacted]d here for TOLAC.  FWB:  - Fetal well being assessed: Category 1        GBS: - GBS negative  LABOR: - Latent labor, doing well. - Contractions are still painful but have spaced. Minimal change from prior exam. Cervix is still posterior. - Discussed options with Tiffany Franco. Membrane sweep performed. Consider augmentation if no cervical change with next exam. - Pain Management: Labor support without medications, plans epidural - Anticipate SVD   Labor Progress 1635: 4/50/-3 1915: 5/60/-2 2235: 5/60/-2  Active Problems:   Labor and delivery, indication for care   SUBJECTIVE/OBJECTIVE   SUBJECTIVE:  Tiffany Franco has been up on the ball and moving around the room. She is feeling painful contractions.   OBJECTIVE: Vital Signs: Patient Vitals for the past 12 hrs:  BP Temp Temp src Pulse Resp Height Weight  07/26/23 1945 123/76 97.9 F (36.6 C) Oral 88 18 5\' 3"  (1.6 m) 83 kg  07/26/23 1635 124/80 -- -- (!) 102 16 -- --    Last SVE:  Dilation: 5 Effacement (%): 60 Station: -2 Presentation: Vertex Exam by:: Quitman Livings CNM  FHR:   - Baseline: 135 - Variability: moderate - Accels: present - Decels: absent Category 1  UTERINE ACTIVITY:  Contractions: q 3-6 minutes  Glenetta Borg, CNM

## 2023-07-27 ENCOUNTER — Encounter: Payer: Self-pay | Admitting: Obstetrics

## 2023-07-27 ENCOUNTER — Inpatient Hospital Stay: Payer: Medicaid Other | Admitting: Anesthesiology

## 2023-07-27 DIAGNOSIS — O34219 Maternal care for unspecified type scar from previous cesarean delivery: Secondary | ICD-10-CM | POA: Diagnosis not present

## 2023-07-27 DIAGNOSIS — Z3A39 39 weeks gestation of pregnancy: Secondary | ICD-10-CM

## 2023-07-27 LAB — RPR: RPR Ser Ql: NONREACTIVE

## 2023-07-27 MED ORDER — FENTANYL-BUPIVACAINE-NACL 0.5-0.125-0.9 MG/250ML-% EP SOLN
12.0000 mL/h | EPIDURAL | Status: DC | PRN
  Administered 2023-07-27: 12 mL/h via EPIDURAL
  Filled 2023-07-27: qty 250

## 2023-07-27 MED ORDER — EPHEDRINE 5 MG/ML INJ: 10.0000 mg | INTRAVENOUS | Status: DC | PRN

## 2023-07-27 MED ORDER — OXYTOCIN-SODIUM CHLORIDE 30-0.9 UT/500ML-% IV SOLN: 2.5000 [IU]/h | INTRAVENOUS | Status: DC | PRN

## 2023-07-27 MED ORDER — DOCUSATE SODIUM 100 MG PO CAPS
100.0000 mg | ORAL_CAPSULE | Freq: Two times a day (BID) | ORAL | Status: DC
  Administered 2023-07-27 – 2023-07-28 (×2): 100 mg via ORAL
  Filled 2023-07-27 (×2): qty 1

## 2023-07-27 MED ORDER — MISOPROSTOL 200 MCG PO TABS
ORAL_TABLET | ORAL | Status: AC
  Filled 2023-07-27: qty 4

## 2023-07-27 MED ORDER — PRENATAL MULTIVITAMIN CH
1.0000 | ORAL_TABLET | Freq: Every day | ORAL | Status: DC
  Administered 2023-07-27: 1 via ORAL
  Filled 2023-07-27: qty 1

## 2023-07-27 MED ORDER — TERBUTALINE SULFATE 1 MG/ML IJ SOLN: 0.2500 mg | Freq: Once | INTRAMUSCULAR | Status: DC | PRN

## 2023-07-27 MED ORDER — PHENYLEPHRINE 80 MCG/ML (10ML) SYRINGE FOR IV PUSH (FOR BLOOD PRESSURE SUPPORT): 80.0000 ug | PREFILLED_SYRINGE | INTRAVENOUS | Status: DC | PRN

## 2023-07-27 MED ORDER — AMMONIA AROMATIC IN INHA
RESPIRATORY_TRACT | Status: AC
  Filled 2023-07-27: qty 10

## 2023-07-27 MED ORDER — IBUPROFEN 600 MG PO TABS
600.0000 mg | ORAL_TABLET | Freq: Four times a day (QID) | ORAL | Status: DC
  Administered 2023-07-27 – 2023-07-28 (×3): 600 mg via ORAL
  Filled 2023-07-27 (×3): qty 1

## 2023-07-27 MED ORDER — EPHEDRINE 5 MG/ML INJ
10.0000 mg | INTRAVENOUS | Status: AC | PRN
  Administered 2023-07-27 (×2): 10 mg via INTRAVENOUS
  Filled 2023-07-27: qty 5

## 2023-07-27 MED ORDER — BENZOCAINE-MENTHOL 20-0.5 % EX AERO
1.0000 | INHALATION_SPRAY | CUTANEOUS | Status: DC | PRN
  Filled 2023-07-27: qty 56

## 2023-07-27 MED ORDER — SODIUM CHLORIDE 0.9 % IV SOLN
INTRAVENOUS | Status: DC | PRN
  Administered 2023-07-27 (×2): 5 mL via EPIDURAL

## 2023-07-27 MED ORDER — ACETAMINOPHEN 325 MG PO TABS
650.0000 mg | ORAL_TABLET | ORAL | Status: DC | PRN
Start: 2023-07-27 — End: 2023-07-28
  Administered 2023-07-27: 650 mg via ORAL
  Filled 2023-07-27: qty 2

## 2023-07-27 MED ORDER — LACTATED RINGERS IV SOLN
500.0000 mL | Freq: Once | INTRAVENOUS | Status: AC
  Administered 2023-07-27: 500 mL via INTRAVENOUS

## 2023-07-27 MED ORDER — WITCH HAZEL-GLYCERIN EX PADS
MEDICATED_PAD | CUTANEOUS | Status: DC | PRN
  Filled 2023-07-27: qty 100

## 2023-07-27 MED ORDER — LIDOCAINE HCL (PF) 1 % IJ SOLN
INTRAMUSCULAR | Status: AC
  Filled 2023-07-27: qty 30

## 2023-07-27 MED ORDER — OXYTOCIN 10 UNIT/ML IJ SOLN
INTRAMUSCULAR | Status: AC
  Filled 2023-07-27: qty 2

## 2023-07-27 MED ORDER — SIMETHICONE 80 MG PO CHEW
80.0000 mg | CHEWABLE_TABLET | ORAL | Status: DC | PRN
Start: 2023-07-27 — End: 2023-07-28

## 2023-07-27 MED ORDER — DIPHENHYDRAMINE HCL 50 MG/ML IJ SOLN: 12.5000 mg | INTRAMUSCULAR | Status: DC | PRN

## 2023-07-27 MED ORDER — DIPHENHYDRAMINE HCL 25 MG PO CAPS: 25.0000 mg | ORAL_CAPSULE | Freq: Four times a day (QID) | ORAL | Status: DC | PRN

## 2023-07-27 MED ORDER — LIDOCAINE-EPINEPHRINE (PF) 1.5 %-1:200000 IJ SOLN
INTRAMUSCULAR | Status: DC | PRN
  Administered 2023-07-27: 3 mL via EPIDURAL

## 2023-07-27 MED ORDER — LIDOCAINE HCL (PF) 1 % IJ SOLN
INTRAMUSCULAR | Status: DC | PRN
  Administered 2023-07-27: 2 mL via SUBCUTANEOUS

## 2023-07-27 MED ORDER — MENTHOL 3 MG MT LOZG
1.0000 | LOZENGE | OROMUCOSAL | Status: DC | PRN
  Administered 2023-07-27: 3 mg via ORAL
  Filled 2023-07-27 (×2): qty 9

## 2023-07-27 MED ORDER — OXYTOCIN-SODIUM CHLORIDE 30-0.9 UT/500ML-% IV SOLN
1.0000 m[IU]/min | INTRAVENOUS | Status: DC
  Administered 2023-07-27: 2 m[IU]/min via INTRAVENOUS

## 2023-07-27 NOTE — Anesthesia Procedure Notes (Signed)
Epidural Patient location during procedure: OB Start time: 07/27/2023 1:23 AM End time: 07/27/2023 1:26 AM  Staffing Anesthesiologist: Lenard Simmer, MD Performed: anesthesiologist   Preanesthetic Checklist Completed: patient identified, IV checked, site marked, risks and benefits discussed, surgical consent, monitors and equipment checked, pre-op evaluation and timeout performed  Epidural Patient position: sitting Prep: ChloraPrep Patient monitoring: heart rate, continuous pulse ox and blood pressure Approach: midline Location: L3-L4 Injection technique: LOR saline  Needle:  Needle type: Tuohy  Needle gauge: 17 G Needle length: 9 cm Needle insertion depth: 4.5 cm Catheter type: closed end flexible Catheter size: 19 Gauge Catheter at skin depth: 9.5 cm Test dose: negative and 1.5% lidocaine with Epi 1:200 K  Assessment Sensory level: T10 Events: blood not aspirated, no cerebrospinal fluid, injection not painful, no injection resistance, no paresthesia and negative IV test  Additional Notes 1st attempt Pt. Evaluated and documentation done after procedure finished. Patient identified. Risks/Benefits/Options discussed with patient including but not limited to bleeding, infection, nerve damage, paralysis, failed block, incomplete pain control, headache, blood pressure changes, nausea, vomiting, reactions to medication both or allergic, itching and postpartum back pain. Confirmed with bedside nurse the patient's most recent platelet count. Confirmed with patient that they are not currently taking any anticoagulation, have any bleeding history or any family history of bleeding disorders. Patient expressed understanding and wished to proceed. All questions were answered. Sterile technique was used throughout the entire procedure. Please see nursing notes for vital signs. Test dose was given through epidural catheter and negative prior to continuing to dose epidural or start infusion.  Warning signs of high block given to the patient including shortness of breath, tingling/numbness in hands, complete motor block, or any concerning symptoms with instructions to call for help. Patient was given instructions on fall risk and not to get out of bed. All questions and concerns addressed with instructions to call with any issues or inadequate analgesia.    Patient tolerated the insertion well without immediate complications.Reason for block:procedure for pain

## 2023-07-27 NOTE — Progress Notes (Signed)
Labor Progress Note   ASSESSMENT/PLAN   Tiffany Franco 38 y.o.   [id number]  at [redacted]w[redacted]d here for TOLAC.  FWB:  - Fetal well being assessed: Category I/II        GBS: - GBS negative  LABOR: - Latent labor, doing well. - Contractions getting closer together but not in a regular pattern yet. - Some periods of fetal tachycardia. IV bolus given. - Continue to titrate Pitocin. Consider AROM when head better applied. - Pain Management: Epidural - Anticipate SVD    Active Problems:   Labor and delivery, indication for care   SUBJECTIVE/OBJECTIVE   SUBJECTIVE:  Tiffany Franco is comfortable with her epidural and is resting.   OBJECTIVE: Vital Signs: Patient Vitals for the past 12 hrs:  BP Temp Temp src Pulse Resp SpO2 Height Weight  07/27/23 0350 (!) 96/35 -- -- 87 -- 98 % -- --  07/27/23 0340 -- 98 F (36.7 C) Oral -- -- -- -- --  07/27/23 0330 -- -- -- 90 -- 97 % -- --  07/27/23 0325 -- -- -- -- -- 99 % -- --  07/27/23 0320 (!) 88/44 -- -- 85 -- 99 % -- --  07/27/23 0315 -- -- -- -- -- 99 % -- --  07/27/23 0310 -- -- -- -- -- 100 % -- --  07/27/23 0305 -- -- -- -- -- 100 % -- --  07/27/23 0300 -- -- -- -- -- 100 % -- --  07/27/23 0255 -- -- -- -- -- 100 % -- --  07/27/23 0250 (!) 92/41 -- -- 88 -- 100 % -- --  07/27/23 0245 -- -- -- -- -- 100 % -- --  07/27/23 0240 -- -- -- -- -- 99 % -- --  07/27/23 0235 (!) 98/51 98.2 F (36.8 C) Oral 81 -- 99 % -- --  07/27/23 0230 (!) 86/43 -- -- 91 -- 99 % -- --  07/27/23 0225 -- -- -- -- -- 100 % -- --  07/27/23 0220 (!) 91/42 -- -- 97 -- 100 % -- --  07/27/23 0215 -- -- -- -- -- 100 % -- --  07/27/23 0210 -- -- -- -- -- 100 % -- --  07/27/23 0205 115/67 -- -- 91 -- 99 % -- --  07/27/23 0200 -- -- -- -- -- 99 % -- --  07/26/23 1945 123/76 97.9 F (36.6 C) Oral 88 18 -- 5\' 3"  (1.6 m) 83 kg    Last SVE:  Dilation: 5 Effacement (%): 60 Station: -2 Presentation: Vertex Exam by:: Quitman Livings, CNM   FHR:   - Baseline: 155 -  Variability: moderate - Accels: present - Decels: none Category I/II  UTERINE ACTIVITY:  Contractions:  Glenetta Borg, CNM

## 2023-07-27 NOTE — Discharge Summary (Signed)
Postpartum Discharge Summary  Date of Service updated***     Patient Name: Tiffany Franco DOB: Aug 23, 1985 MRN: [id number]  Date of admission: 07/26/2023 Delivery date:07/27/2023 Delivering provider: Glenetta Borg Date of discharge: 07/27/2023  Admitting diagnosis: Labor and delivery, indication for care [O75.9] Intrauterine pregnancy: [redacted]w[redacted]d     Secondary diagnosis:  Active Problems:   Labor and delivery, indication for care VBAC  Additional problems: none    Discharge diagnosis: Term Pregnancy Delivered and VBAC                                              Post partum procedures:{Postpartum procedures:23558} Augmentation: AROM and Pitocin Complications: None  Hospital course: Onset of Labor With Vaginal Delivery      38 y.o. yo [id number] at [redacted]w[redacted]d was admitted in Latent Labor on 07/26/2023. Labor course was complicated by prolonged latent phase with Pitocin augmentation.   Membrane Rupture Time/Date: 6:42 AM,07/27/2023  Delivery Method:VBAC, Spontaneous Operative Delivery:N/A Episiotomy: none Lacerations:  1st degree;Perineal Patient had a postpartum course complicated by ***.  She is ambulating, tolerating a regular diet, passing flatus, and urinating well. Patient is discharged home in stable condition on 07/27/23.  Newborn Data: Birth date:07/27/2023 Birth time:8:09 AM Gender:Female Living status:Living Apgars: ,  Weight:   Magnesium Sulfate received: No BMZ received: No Rhophylac:N/A MMR:N/A T-DaP:{Tdap:23962} Flu: No RSV Vaccine received: No Transfusion:{Transfusion received:30440034} Immunizations administered: Immunization History  Administered Date(s) Administered   MMR 05/13/2016    Physical exam  Vitals:   07/27/23 0819 07/27/23 0821 07/27/23 0824 07/27/23 0829  BP:  120/65    Pulse:  90    Resp:      Temp:      TempSrc:      SpO2: 98%  98% 98%  Weight:      Height:       General: {Exam; general:21111117} Lochia: {Desc;  appropriate/inappropriate:30686::"appropriate"} Uterine Fundus: {Desc; firm/soft:30687} Incision: {Exam; incision:21111123} DVT Evaluation: {Exam; dvt:2111122} Labs: Lab Results  Component Value Date   WBC 11.6 (H) 07/26/2023   HGB 11.2 (L) 07/26/2023   HCT 32.2 (L) 07/26/2023   MCV 90.7 07/26/2023   PLT 236 07/26/2023       No data to display         Edinburgh Score:    01/03/2023    3:28 PM  Edinburgh Postnatal Depression Scale Screening Tool  I have been able to laugh and see the funny side of things. 0  I have looked forward with enjoyment to things. 0  I have blamed myself unnecessarily when things went wrong. 0  I have been anxious or worried for no good reason. 0  I have felt scared or panicky for no good reason. 0  Things have been getting on top of me. 0  I have been so unhappy that I have had difficulty sleeping. 0  I have felt sad or miserable. 2  I have been so unhappy that I have been crying. 2  The thought of harming myself has occurred to me. 0  Edinburgh Postnatal Depression Scale Total 4      After visit meds:  Allergies as of 07/27/2023   No Known Allergies   Med Rec must be completed prior to using this [facility]***        Discharge home in stable condition Infant Feeding: {Baby feeding:23562} Infant Disposition:{CHL IP OB  HOME WITH ZOXWRU:04540} Discharge instruction: per After Visit Summary and Postpartum booklet. Activity: Advance as tolerated. Pelvic rest for 6 weeks.  Diet: {OB diet:21111121} Anticipated Birth Control:  vasectomy Postpartum Appointment:{Outpatient follow up:23559} Additional Postpartum F/U: {PP Procedure:23957} Future Appointments: Future Appointments  Date Time Provider Department Center  08/09/2023  9:35 AM Linzie Collin, MD [facility] None   Follow up Visit:  Follow-up Information     Glenetta Borg, CNM. Schedule an appointment as soon as possible for a visit.   Specialty: Obstetrics Why: Video visit  in 2 weeks Office visit in 6 weeks Contact information: [address] [place] [address] [phone]                     07/27/2023 Glenetta Borg, CNM

## 2023-07-27 NOTE — Anesthesia Preprocedure Evaluation (Signed)
Anesthesia Evaluation  Patient identified by MRN, date of birth, ID band Patient awake    Reviewed: Allergy & Precautions, H&P , NPO status , Patient's Chart, lab work & pertinent test results  History of Anesthesia Complications Negative for: history of anesthetic complications  Airway Mallampati: III  TM Distance: >3 FB Neck ROM: full    Dental no notable dental hx. (+) Chipped   Pulmonary neg pulmonary ROS   Pulmonary exam normal        Cardiovascular Exercise Tolerance: Good negative cardio ROS Normal cardiovascular exam     Neuro/Psych  PSYCHIATRIC DISORDERS Anxiety Depression       GI/Hepatic ,GERD  ,,  Endo/Other    Renal/GU   negative genitourinary   Musculoskeletal   Abdominal   Peds  Hematology negative hematology ROS (+)   Anesthesia Other Findings Past Medical History: No date: Anxiety No date: Recurrent UTI  Past Surgical History: No date: CESAREAN SECTION  BMI    Body Mass Index: 32.59 kg/m      Reproductive/Obstetrics (+) Pregnancy                             Anesthesia Physical Anesthesia Plan  ASA: 2  Anesthesia Plan: Epidural   Post-op Pain Management:    Induction:   PONV Risk Score and Plan:   Airway Management Planned: Natural Airway  Additional Equipment:   Intra-op Plan:   Post-operative Plan:   Informed Consent: I have reviewed the patients History and Physical, chart, labs and discussed the procedure including the risks, benefits and alternatives for the proposed anesthesia with the patient or authorized representative who has indicated his/her understanding and acceptance.     Dental Advisory Given  Plan Discussed with: Anesthesiologist  Anesthesia Plan Comments: (Notes about recent leg weakness and numbness in the patients chart.  Patient reports that these symptoms were related to leg edema and have resolved.   She reports no  problems with spinal or epidural with her last pregnancy.  Consented that neuraxial could worsen a neurologic injury or disease which she voiced understanding to.  Patient reports no bleeding problems and no anticoagulant use.   Patient consented for risks of anesthesia including but not limited to:  - adverse reactions to medications - risk of bleeding, infection and or nerve damage from epidural that could lead to paralysis - risk of headache or failed epidural - nerve damage due to positioning - that if epidural is used for C-section that there is a chance of epidural failure requiring spinal placement or conversion to GA - Damage to heart, brain, lungs, other parts of body or loss of life  Patient voiced understanding.)        Anesthesia Quick Evaluation

## 2023-07-27 NOTE — Progress Notes (Signed)
Labor Progress Note   ASSESSMENT/PLAN   Daralyn Stegmaier 38 y.o.   [id number]  at [redacted]w[redacted]d here for TOLAC.  FWB:  - Fetal well being assessed: Category 1        GBS: - GBS negative  LABOR: -  Latent labor, minimal cervical change - Contractions still spaced after sweep - Discussed options with [place]. Will augment with Pitocin. AROM when appropriate - Desires epidural now  - Anticipate SVD   Labor Progress 1635: 4/50/-3 1915: 5/60/-2 2235: 5/60/-2 0030: 5/60/-2  Active Problems:   Labor and delivery, indication for care   SUBJECTIVE/OBJECTIVE   SUBJECTIVE:  Contractions have spaced out. Jessly is sleeping between contractions. Desires augmentation and epidural.   OBJECTIVE: Vital Signs: Patient Vitals for the past 12 hrs:  BP Temp Temp src Pulse Resp Height Weight  07/26/23 1945 123/76 97.9 F (36.6 C) Oral 88 18 5\' 3"  (1.6 m) 83 kg  07/26/23 1635 124/80 -- -- (!) 102 16 -- --    Last SVE:  Dilation: 5 Effacement (%): 60 Station: -2 Presentation: Vertex Exam by:: Quitman Livings CNM   FHR:   - Baseline: 140 - Variability: moderate - Accels: present - Decels: absent Category 1  UTERINE ACTIVITY:  Contractions: q 5-8 minutes  Glenetta Borg, CNM

## 2023-07-27 NOTE — Progress Notes (Signed)
Labor Progress Note   ASSESSMENT/PLAN   Tiffany Franco 38 y.o.   [id number]  at [redacted]w[redacted]d here for TOLAC.  FWB:  - Fetal well being assessed: Category 1        GBS: - GBS negative  LABOR: - Latent labor - Small cervical change since prior exam  - Discussed options with [place]. AROM performed for small amount of clear/blood tinged fluid. - Pain Management: Epidural - Anticipate SVD  - Updated Dr. Valentino Saxon on patient status  Labor Progress   Active Problems:   Labor and delivery, indication for care   SUBJECTIVE/OBJECTIVE   SUBJECTIVE:  Tiffany Franco is resting comfortably  OBJECTIVE: Vital Signs: Patient Vitals for the past 12 hrs:  BP Temp Temp src Pulse Resp SpO2 Height Weight  07/27/23 0700 -- -- -- -- -- 99 % -- --  07/27/23 0655 -- -- -- -- -- 99 % -- --  07/27/23 0650 122/73 -- -- 96 -- 99 % -- --  07/27/23 0645 -- -- -- -- -- 99 % -- --  07/27/23 0640 -- -- -- -- -- 99 % -- --  07/27/23 0635 -- -- -- -- -- 96 % -- --  07/27/23 0630 -- -- -- -- -- 96 % -- --  07/27/23 0625 -- -- -- -- -- 97 % -- --  07/27/23 0620 -- -- -- -- -- 95 % -- --  07/27/23 0615 -- -- -- -- -- 96 % -- --  07/27/23 0610 -- -- -- -- -- 95 % -- --  07/27/23 0605 -- -- -- -- -- 95 % -- --  07/27/23 0600 -- -- -- -- -- 95 % -- --  07/27/23 0555 -- -- -- -- -- 95 % -- --  07/27/23 0550 -- -- -- -- -- 96 % -- --  07/27/23 0545 -- -- -- -- -- 95 % -- --  07/27/23 0540 -- -- -- -- -- 97 % -- --  07/27/23 0535 -- -- -- -- -- 97 % -- --  07/27/23 0530 -- -- -- -- -- 97 % -- --  07/27/23 0525 -- -- -- -- -- 98 % -- --  07/27/23 0520 (!) 91/51 -- -- 90 -- 99 % -- --  07/27/23 0515 -- -- -- -- -- 100 % -- --  07/27/23 0510 -- -- -- -- -- 100 % -- --  07/27/23 0505 -- -- -- -- -- 98 % -- --  07/27/23 0500 -- -- -- -- -- 98 % -- --  07/27/23 0455 -- -- -- -- -- 99 % -- --  07/27/23 0450 105/61 99.4 F (37.4 C) Oral 84 -- 99 % -- --  07/27/23 0445 -- -- -- -- -- 100 % -- --  07/27/23 0440 -- -- -- --  -- 98 % -- --  07/27/23 0435 (!) 105/52 -- -- (!) 103 -- 97 % -- --  07/27/23 0430 -- -- -- -- -- 97 % -- --  07/27/23 0425 -- -- -- -- -- 97 % -- --  07/27/23 0420 (!) 86/37 -- -- 86 -- 97 % -- --  07/27/23 0415 -- -- -- -- -- 98 % -- --  07/27/23 0410 -- -- -- -- -- 98 % -- --  07/27/23 0405 -- -- -- -- -- 97 % -- --  07/27/23 0400 -- -- -- -- -- 97 % -- --  07/27/23 0355 -- -- -- -- -- 98 % -- --  07/27/23 0350 (!) 96/35 -- --  87 -- 98 % -- --  07/27/23 0345 -- -- -- -- -- 97 % -- --  07/27/23 0340 -- 98 F (36.7 C) Oral -- -- 97 % -- --  07/27/23 0335 -- -- -- -- -- 98 % -- --  07/27/23 0330 -- -- -- 90 -- 97 % -- --  07/27/23 0325 -- -- -- -- -- 99 % -- --  07/27/23 0320 (!) 88/44 -- -- 85 -- 99 % -- --  07/27/23 0315 -- -- -- -- -- 99 % -- --  07/27/23 0310 -- -- -- -- -- 100 % -- --  07/27/23 0305 -- -- -- -- -- 100 % -- --  07/27/23 0300 -- -- -- -- -- 100 % -- --  07/27/23 0255 -- -- -- -- -- 100 % -- --  07/27/23 0250 (!) 92/41 -- -- 88 -- 100 % -- --  07/27/23 0245 -- -- -- -- -- 100 % -- --  07/27/23 0240 -- -- -- -- -- 99 % -- --  07/27/23 0235 (!) 98/51 98.2 F (36.8 C) Oral 81 -- 99 % -- --  07/27/23 0230 (!) 86/43 -- -- 91 -- 99 % -- --  07/27/23 0225 -- -- -- -- -- 100 % -- --  07/27/23 0220 (!) 91/42 -- -- 97 -- 100 % -- --  07/27/23 0215 -- -- -- -- -- 100 % -- --  07/27/23 0210 -- -- -- -- -- 100 % -- --  07/27/23 0205 115/67 -- -- 91 -- 99 % -- --  07/27/23 0200 -- -- -- -- -- 99 % -- --  07/27/23 0155 -- -- -- -- -- 99 % -- --  07/27/23 0150 116/63 -- -- 91 -- 99 % -- --  07/27/23 0145 119/67 -- -- 99 -- 99 % -- --  07/27/23 0140 123/65 -- -- 93 -- 100 % -- --  07/27/23 0135 107/74 -- -- (!) 103 -- 100 % -- --  07/27/23 0130 137/73 -- -- (!) 106 -- 100 % -- --  07/27/23 0125 -- -- -- -- -- 100 % -- --  07/27/23 0120 -- -- -- -- -- 100 % -- --  07/26/23 1945 123/76 97.9 F (36.6 C) Oral 88 18 -- 5\' 3"  (1.6 m) 83 kg    Last SVE:  Dilation:  5.5 Effacement (%): 60, 70 Station: -2 Presentation: Vertex Exam by:: Quitman Livings, CNM -  , Rupture Date: 07/27/23, Rupture Time: 0642,    FHR:   - Baseline: 145 - Variability: moderate - Accels: present - Decels: none Category 1  UTERINE ACTIVITY:  Contractions: q 2-5 min  Glenetta Borg, CNM

## 2023-07-28 ENCOUNTER — Ambulatory Visit: Payer: Self-pay

## 2023-07-28 MED ORDER — IBUPROFEN 600 MG PO TABS
600.0000 mg | ORAL_TABLET | Freq: Four times a day (QID) | ORAL | Status: DC
  Administered 2023-07-28: 600 mg via ORAL
  Filled 2023-07-28: qty 1

## 2023-07-28 MED ORDER — COCONUT OIL OIL
TOPICAL_OIL | Status: AC
  Filled 2023-07-28: qty 7.5

## 2023-07-28 NOTE — Anesthesia Postprocedure Evaluation (Signed)
Anesthesia Post Note  Patient: Tiffany Franco  Procedure(s) Performed: AN AD HOC LABOR EPIDURAL  Patient location during evaluation: Mother Baby Anesthesia Type: Epidural Level of consciousness: awake Pain management: pain level controlled Respiratory status: spontaneous breathing Cardiovascular status: stable Postop Assessment: no headache Anesthetic complications: no   No notable events documented.   Last Vitals:  Vitals:   07/27/23 2000 07/27/23 2217  BP: 123/73 105/72  Pulse: 70 72  Resp: 19 16  Temp: 36.6 C 36.6 C  SpO2: 97% 99%    Last Pain:  Vitals:   07/27/23 2217  TempSrc: Oral  PainSc: 0-No pain                 Jaye Beagle

## 2023-07-28 NOTE — Lactation Note (Signed)
This note was copied from a baby's chart. Lactation Consultation Note  Patient Name: Tiffany Franco ZOXWR'U Date: 07/28/2023 Age:39 hours  Reason for consult: Initial assessment, nipple pain, mother's request, RN request, Term   Maternal Data  Mom [phone], VBAC. Mom with history of anxiety, anemia,AMA. Mom is an experienced breastfeeding mom.   On initial visit mom reports a painful latch. Assisted mom with breastfeeding.  Feeding  Mother's feeding choice on admission:breastmilk  Provided mom tips and strategies to maximize latch technique. Mom reports no pain with latch modification. Mom's nipples are intact.  LATCH Score=8   Lactation Tools Discussed/Used  Harmony manual pump  Interventions  Breastfeeding basics, assisted with latch, position options, breast compression, hand pump  Discharge  Engorgement and breast care management When to call the Pediatrician Outpatient referral  Consult Status  Complete 07/28/23 inpatient  Update provided to care nurse  Fuller Song 07/28/2023, 9:07 PM

## 2023-07-28 NOTE — Discharge Instructions (Signed)
Call office if you have any of the following:  -Persistent headache or visual changes (possible high blood pressure) -Fever >101.0 F or chills (possible infection) -Breast concerns (engorgement, mastitis) -Excessive vaginal bleeding (soaking through more than one pad in 1 hr x 2 hr) -Incision drainage/redness/increased pain/warmth at site (possible infection)  -Leg pain or redness (possible blood clot) -Depression/anxiety increased symptoms 2 weeks after delivery  Activity & Hygiene: -Do not lift > 10 lbs for 6 weeks.  -No intercourse or tampons for 6 weeks.  -No swimming pools, hot tubs or tub baths- showers only for 6 weeks **No driving for 1-2 weeks or while taking pain medication after c-section  -It is normal to bleed for up to 6 weeks. You should not soak through more than 1 pad in 1 hour x 2 hours.  Breastfeeding: -Continue prenatal vitamin.  -Increase calories and fluids.  -Your milk will come in, in the next couple of days (right now it is colostrum).  -You may have a slight fever when your milk comes in, but it should go away on its own.   -If it does not, and rises above 101 F please call the doctor.  -You will also feel achy and your breasts will be firm. They will also start to leak.  *For breastfeeding concerns, the lactation consultant can be reached at [phone]  Not Breastfeeding: -Avoid breast stimulation and wear supportive bra -ICE helps decrease inflammation and pain -Express milk for comfort by hand, do not empty breast  Postpartum blues: -feelings of happy one minute and sad another minute are normal for the first few weeks. -if it gets worse please let your doctor know. It is very common!!

## 2023-07-28 NOTE — Progress Notes (Signed)
Patient discharged home with family. Discharge instructions, when to follow up, and prescriptions reviewed with patient. Patient verbalized understanding. Patient will be escorted out by auxiliary.

## 2023-07-29 ENCOUNTER — Inpatient Hospital Stay
Admission: RE | Admit: 2023-07-29 | Payer: Medicaid Other | Source: Home / Self Care | Admitting: Obstetrics and Gynecology

## 2023-07-29 ENCOUNTER — Encounter: Admission: RE | Payer: Self-pay | Source: Home / Self Care

## 2023-07-29 LAB — BPAM RBC
Blood Product Expiration Date: 202502112359
Blood Product Expiration Date: 202502112359
Unit Type and Rh: 5100
Unit Type and Rh: 5100

## 2023-07-29 LAB — TYPE AND SCREEN
ABO/RH(D): O POS
Antibody Screen: POSITIVE
Unit division: 0
Unit division: 0

## 2023-07-29 SURGERY — Surgical Case
Anesthesia: Choice

## 2023-08-02 ENCOUNTER — Encounter: Payer: Medicaid Other | Admitting: Physical Therapy

## 2023-08-09 ENCOUNTER — Encounter: Payer: Medicaid Other | Admitting: Obstetrics and Gynecology

## 2023-08-09 ENCOUNTER — Encounter: Payer: Self-pay | Admitting: Obstetrics

## 2023-08-09 ENCOUNTER — Encounter: Payer: Medicaid Other | Admitting: Physical Therapy

## 2023-08-09 ENCOUNTER — Telehealth (INDEPENDENT_AMBULATORY_CARE_PROVIDER_SITE_OTHER): Payer: Medicaid Other | Admitting: Obstetrics

## 2023-08-09 NOTE — Progress Notes (Signed)
   Virtual Visit via Video Note   I connected with [name]  on 08/09/23 11:01 AM by a video enabled telemedicine application and verified that I am speaking with the correct person using two identifiers.   Location: Patient: home Provider: [facility] clinic   I discussed the limitations of evaluation and management by telemedicine and the availability of in person appointments. The patient expressed understanding and agreed to proceed. Subjective:    Subjective  Tiffany Franco is a 38 y.o. female who presents for a postpartum visit. She is2 weeks  postpartum following a VBAC. I have fully reviewed the prenatal and intrapartum course. The delivery was at 39w .  Anesthesia: epidural.    Postpartum course has been normal. She has no pain and no other concerns. Baby's course has been normal. Baby is feeding by  breast. Bleeding is light. Bowel function is normal. Bladder function is normal.  .  Contraception method: vasectomy. Postpartum depression screening:      Review of Systems Pertinent positives noted in HPI. Remainder of comprehensive ROS otherwise negative.     Objective:      Objective [] Expand by Default There were no vitals taken for this visit.  General:  alert, cooperative, and no distress        Assessment:    Normal postpartum course, mood stable.   Plan:      Plan -Anticipatory guidance about the postpartum period -Reviewed s/s of PPD and PPA -Discussed when to seek medical care -Office visit at 6 weeks postpartum  I discussed the assessment and treatment plan with the patient. The patient was provided an opportunity to ask questions and all were answered. The patient agreed with the plan and demonstrated an understanding of the instructions.   The patient was advised to call back or seek an in-person evaluation if the symptoms worsen or if the condition fails to improve as anticipated.   I provided 7 minutes of non-face-to-face time during this  encounter.     Glenetta Borg, CNM

## 2023-08-16 ENCOUNTER — Encounter: Payer: Medicaid Other | Admitting: Physical Therapy

## 2023-09-06 ENCOUNTER — Ambulatory Visit (INDEPENDENT_AMBULATORY_CARE_PROVIDER_SITE_OTHER): Payer: Medicaid Other | Admitting: Obstetrics

## 2023-09-06 NOTE — Progress Notes (Signed)
 Post Partum Visit Note  Tiffany Franco is a 38 y.o. [id number] female who presents for a postpartum visit. She is 6 weeks postpartum following a VBAC.  I have fully reviewed the prenatal and intrapartum course and was present at the birth. The delivery was at 39 gestational weeks.  Anesthesia: epidural. Postpartum course has been uncomplicated. Baby is doing well. Baby is feeding by breast. Bleeding: occasional spotting. Bowel function is normal. Bladder function is normal. Patient is not sexually active. Contraception method is vasectomy. Postpartum depression screening: negative.   The pregnancy intention screening data noted above was reviewed. Potential methods of contraception were discussed. The patient elected to proceed with No data recorded.   Edinburgh Postnatal Depression Scale - 09/06/23 1114       Edinburgh Postnatal Depression Scale:  In the Past 7 Days   I have been able to laugh and see the funny side of things. 0    I have looked forward with enjoyment to things. 0    I have blamed myself unnecessarily when things went wrong. 0    I have been anxious or worried for no good reason. 0    I have felt scared or panicky for no good reason. 0    Things have been getting on top of me. 0    I have been so unhappy that I have had difficulty sleeping. 0    I have felt sad or miserable. 0    I have been so unhappy that I have been crying. 0    The thought of harming myself has occurred to me. 0    Edinburgh Postnatal Depression Scale Total 0             There are no preventive care reminders to display for this patient.  The following portions of the patient's history were reviewed and updated as appropriate: allergies, current medications, past family history, past medical history, and problem list.  Review of Systems Pertinent items are noted in HPI.  Objective:  BP 136/79 (BP Location: Left Arm, Patient Position: Sitting, Cuff Size: Normal)   Pulse 74   Ht 5\' 3"   (1.6 m)   Wt 159 lb 11.2 oz (72.4 kg)   LMP 10/27/2022 (Approximate)   Breastfeeding Yes   BMI 28.29 kg/m    General:  alert, cooperative, and appears stated age   Breasts:  normal  Lungs: clear to auscultation bilaterally  Heart:  regular rate and rhythm, S1, S2 normal, no murmur, click, rub or gallop  Abdomen: soft, non-tender; bowel sounds normal; no masses,  no organomegaly   Wound: Laceration well-healed  GU exam:  normal       Assessment:   Normal postpartum exam.   Plan:   Essential components of care per ACOG recommendations:  1.  Mood and well being: Patient with negative depression screening today. Reviewed local resources for support.  - Patient tobacco use? No.   - hx of drug use? No.    2. Infant care and feeding:  -Patient currently breastmilk feeding? Yes. Reviewed importance of draining breast regularly to support lactation.  -Social determinants of health (SDOH) reviewed in EPIC. No concerns.  3. Sexuality, contraception and birth spacing - Patient does not want a pregnancy in the next year.  Desired family size is 5 children.  - Reviewed reproductive life planning. Reviewed contraceptive methods based on pt preferences and effectiveness.  Patient desired Vasectomy today.   - Discussed birth spacing of 18 months  4. Sleep and fatigue -Encouraged family/partner/community support of 4 hrs of uninterrupted sleep to help with mood and fatigue  5. Physical Recovery  - Discussed patients delivery and complications. She describes her labor as good. - Patient had a  VBAC . Patient had a 1st degree laceration. Perineal healing reviewed. Patient expressed understanding - Patient has urinary incontinence? No. - Patient is safe to resume physical and sexual activity  6.  Health Maintenance  - Last pap smear  Diagnosis  Date Value Ref Range Status  03/02/2023   Final   - Negative for Intraepithelial Lesions or Malignancy (NILM)  03/02/2023 - Benign  reactive/reparative changes  Final   Pap smear not done at today's visit.  -Breast Cancer screening indicated? No.   7. Chronic Disease/Pregnancy Condition follow up: None  - PCP follow up  Glenetta Borg, CNM [facility]

## 2023-12-09 DIAGNOSIS — B9689 Other specified bacterial agents as the cause of diseases classified elsewhere: Secondary | ICD-10-CM | POA: Diagnosis not present

## 2023-12-09 DIAGNOSIS — R519 Headache, unspecified: Secondary | ICD-10-CM | POA: Diagnosis not present

## 2023-12-09 DIAGNOSIS — J019 Acute sinusitis, unspecified: Secondary | ICD-10-CM | POA: Diagnosis not present

## 2023-12-09 DIAGNOSIS — R0981 Nasal congestion: Secondary | ICD-10-CM | POA: Diagnosis not present

## 2024-05-21 ENCOUNTER — Telehealth: Payer: Self-pay

## 2024-05-21 NOTE — Progress Notes (Signed)
 Complex Care Management Note Care Guide Note  05/21/2024 Name: Tiffany Franco MRN: [id number] DOB: 07/24/1985   Complex Care Management Outreach Attempts: An unsuccessful telephone outreach was attempted today to offer the patient information about available complex care management services.  Follow Up Plan:  Additional outreach attempts will be made to offer the patient complex care management information and services.   Encounter Outcome:  No Answer  Dream[facility]  [facility], [facility] VBCI Assistant Direct Dial: [phone]  Fax: [phone]

## 2024-06-04 NOTE — Progress Notes (Unsigned)
 Complex Care Management Note Care Guide Note  06/04/2024 Name: Tiffany Franco MRN: [id number] DOB: [birth year]   Complex Care Management Outreach Attempts: A second unsuccessful outreach was attempted today to offer the patient with information about available complex care management services.  Follow Up Plan:  Additional outreach attempts will be made to offer the patient complex care management information and services.   Encounter Outcome:  No Answer  Dreama [facility]  [facility], [facility], [place] VBCI Assistant Direct Dial: [phone]  Fax: [phone]

## 2024-06-11 NOTE — Progress Notes (Signed)
 Complex Care Management Note Care Guide Note  06/11/2024 Name: Tiffany Franco MRN: [id number] DOB: Jan 24, 1986   Complex Care Management Outreach Attempts: A third unsuccessful outreach was attempted today to offer the patient with information about available complex care management services.  Follow Up Plan:  No further outreach attempts will be made at this time. We have been unable to contact the patient to offer or enroll patient in complex care management services.  Encounter Outcome:  No Answer  Dreama [facility]  [facility], [facility] VBCI Assistant Direct Dial: [phone]  Fax: [phone]
